# Patient Record
Sex: Female | Born: 1965 | Race: Black or African American | Hispanic: No | Marital: Married | Smoking: Never smoker
Health system: Southern US, Community
[De-identification: ages and names within clinical notes are randomized; demographics above are authoritative.]

## PROBLEM LIST (undated history)

## (undated) DIAGNOSIS — F419 Anxiety disorder, unspecified: Secondary | ICD-10-CM

## (undated) DIAGNOSIS — G43909 Migraine, unspecified, not intractable, without status migrainosus: Secondary | ICD-10-CM

## (undated) DIAGNOSIS — I10 Essential (primary) hypertension: Secondary | ICD-10-CM

---

## 2006-02-15 ENCOUNTER — Emergency Department (HOSPITAL_COMMUNITY): Admission: EM | Admit: 2006-02-15 | Discharge: 2006-02-15 | Payer: Self-pay | Admitting: Emergency Medicine

## 2006-05-04 ENCOUNTER — Inpatient Hospital Stay (HOSPITAL_COMMUNITY): Admission: AD | Admit: 2006-05-04 | Discharge: 2006-05-04 | Payer: Self-pay | Admitting: Obstetrics and Gynecology

## 2006-05-07 ENCOUNTER — Inpatient Hospital Stay (HOSPITAL_COMMUNITY): Admission: AD | Admit: 2006-05-07 | Discharge: 2006-05-07 | Payer: Self-pay | Admitting: Obstetrics and Gynecology

## 2006-05-14 ENCOUNTER — Inpatient Hospital Stay (HOSPITAL_COMMUNITY): Admission: AD | Admit: 2006-05-14 | Discharge: 2006-05-14 | Payer: Self-pay | Admitting: Obstetrics and Gynecology

## 2006-05-21 ENCOUNTER — Inpatient Hospital Stay (HOSPITAL_COMMUNITY): Admission: AD | Admit: 2006-05-21 | Discharge: 2006-05-21 | Payer: Self-pay | Admitting: Obstetrics and Gynecology

## 2006-09-13 ENCOUNTER — Emergency Department (HOSPITAL_COMMUNITY): Admission: EM | Admit: 2006-09-13 | Discharge: 2006-09-13 | Payer: Self-pay | Admitting: Emergency Medicine

## 2006-10-18 ENCOUNTER — Emergency Department (HOSPITAL_COMMUNITY): Admission: EM | Admit: 2006-10-18 | Discharge: 2006-10-18 | Payer: Self-pay | Admitting: Emergency Medicine

## 2007-03-30 ENCOUNTER — Emergency Department (HOSPITAL_COMMUNITY): Admission: EM | Admit: 2007-03-30 | Discharge: 2007-03-30 | Payer: Self-pay | Admitting: *Deleted

## 2007-03-30 ENCOUNTER — Inpatient Hospital Stay (HOSPITAL_COMMUNITY): Admission: AD | Admit: 2007-03-30 | Discharge: 2007-03-30 | Payer: Self-pay | Admitting: Obstetrics and Gynecology

## 2007-09-04 ENCOUNTER — Inpatient Hospital Stay (HOSPITAL_COMMUNITY): Admission: AD | Admit: 2007-09-04 | Discharge: 2007-09-04 | Payer: Self-pay | Admitting: Obstetrics & Gynecology

## 2007-11-21 ENCOUNTER — Emergency Department (HOSPITAL_COMMUNITY): Admission: EM | Admit: 2007-11-21 | Discharge: 2007-11-22 | Payer: Self-pay | Admitting: Emergency Medicine

## 2007-12-26 ENCOUNTER — Emergency Department (HOSPITAL_COMMUNITY): Admission: EM | Admit: 2007-12-26 | Discharge: 2007-12-26 | Payer: Self-pay | Admitting: Family Medicine

## 2008-02-22 ENCOUNTER — Inpatient Hospital Stay (HOSPITAL_COMMUNITY): Admission: AD | Admit: 2008-02-22 | Discharge: 2008-02-22 | Payer: Self-pay | Admitting: Obstetrics & Gynecology

## 2008-03-29 ENCOUNTER — Inpatient Hospital Stay (HOSPITAL_COMMUNITY): Admission: AD | Admit: 2008-03-29 | Discharge: 2008-03-29 | Payer: Self-pay | Admitting: Obstetrics & Gynecology

## 2008-03-31 ENCOUNTER — Inpatient Hospital Stay (HOSPITAL_COMMUNITY): Admission: AD | Admit: 2008-03-31 | Discharge: 2008-03-31 | Payer: Self-pay | Admitting: Obstetrics & Gynecology

## 2008-04-07 ENCOUNTER — Inpatient Hospital Stay (HOSPITAL_COMMUNITY): Admission: AD | Admit: 2008-04-07 | Discharge: 2008-04-07 | Payer: Self-pay | Admitting: Obstetrics & Gynecology

## 2008-04-14 ENCOUNTER — Inpatient Hospital Stay (HOSPITAL_COMMUNITY): Admission: AD | Admit: 2008-04-14 | Discharge: 2008-04-14 | Payer: Self-pay | Admitting: Obstetrics & Gynecology

## 2008-04-21 ENCOUNTER — Inpatient Hospital Stay (HOSPITAL_COMMUNITY): Admission: AD | Admit: 2008-04-21 | Discharge: 2008-04-21 | Payer: Self-pay | Admitting: Family Medicine

## 2008-09-24 ENCOUNTER — Emergency Department (HOSPITAL_COMMUNITY): Admission: EM | Admit: 2008-09-24 | Discharge: 2008-09-25 | Payer: Self-pay | Admitting: Emergency Medicine

## 2009-02-13 ENCOUNTER — Emergency Department (HOSPITAL_COMMUNITY): Admission: EM | Admit: 2009-02-13 | Discharge: 2009-02-13 | Payer: Self-pay | Admitting: Emergency Medicine

## 2009-02-28 ENCOUNTER — Inpatient Hospital Stay (HOSPITAL_COMMUNITY): Admission: AD | Admit: 2009-02-28 | Discharge: 2009-02-28 | Payer: Self-pay | Admitting: Obstetrics & Gynecology

## 2009-04-11 ENCOUNTER — Inpatient Hospital Stay (HOSPITAL_COMMUNITY): Admission: AD | Admit: 2009-04-11 | Discharge: 2009-04-11 | Payer: Self-pay | Admitting: Family Medicine

## 2009-05-03 ENCOUNTER — Inpatient Hospital Stay (HOSPITAL_COMMUNITY): Admission: AD | Admit: 2009-05-03 | Discharge: 2009-05-03 | Payer: Self-pay | Admitting: Obstetrics & Gynecology

## 2009-06-01 ENCOUNTER — Ambulatory Visit (HOSPITAL_COMMUNITY): Admission: RE | Admit: 2009-06-01 | Discharge: 2009-06-01 | Payer: Self-pay | Admitting: Obstetrics

## 2009-06-10 ENCOUNTER — Ambulatory Visit: Payer: Self-pay | Admitting: Family

## 2009-06-10 ENCOUNTER — Inpatient Hospital Stay (HOSPITAL_COMMUNITY): Admission: AD | Admit: 2009-06-10 | Discharge: 2009-06-10 | Payer: Self-pay | Admitting: Obstetrics

## 2009-06-11 ENCOUNTER — Inpatient Hospital Stay (HOSPITAL_COMMUNITY): Admission: AD | Admit: 2009-06-11 | Discharge: 2009-06-15 | Payer: Self-pay | Admitting: Obstetrics

## 2009-08-19 ENCOUNTER — Inpatient Hospital Stay (HOSPITAL_COMMUNITY): Admission: AD | Admit: 2009-08-19 | Discharge: 2009-08-20 | Payer: Self-pay | Admitting: Obstetrics

## 2009-09-14 ENCOUNTER — Ambulatory Visit (HOSPITAL_COMMUNITY): Admission: RE | Admit: 2009-09-14 | Discharge: 2009-09-14 | Payer: Self-pay | Admitting: Obstetrics & Gynecology

## 2009-10-09 ENCOUNTER — Inpatient Hospital Stay (HOSPITAL_COMMUNITY): Admission: AD | Admit: 2009-10-09 | Discharge: 2009-10-09 | Payer: Self-pay | Admitting: Obstetrics

## 2009-10-27 ENCOUNTER — Encounter: Payer: Self-pay | Admitting: Obstetrics & Gynecology

## 2009-10-27 ENCOUNTER — Inpatient Hospital Stay (HOSPITAL_COMMUNITY): Admission: RE | Admit: 2009-10-27 | Discharge: 2009-10-30 | Payer: Self-pay | Admitting: Obstetrics & Gynecology

## 2009-11-08 ENCOUNTER — Inpatient Hospital Stay (HOSPITAL_COMMUNITY): Admission: AD | Admit: 2009-11-08 | Discharge: 2009-11-11 | Payer: Self-pay | Admitting: Obstetrics

## 2009-11-08 ENCOUNTER — Ambulatory Visit: Payer: Self-pay | Admitting: Gynecology

## 2010-06-09 LAB — COMPREHENSIVE METABOLIC PANEL
Alkaline Phosphatase: 101 U/L (ref 39–117)
Calcium: 8.7 mg/dL (ref 8.4–10.5)
GFR calc Af Amer: >60 mL/min (ref >60)
Potassium: 3.7 mEq/L (ref 3.5–5.1)
Total Protein: 6.5 g/dL (ref 6.0–8.3)

## 2010-06-09 LAB — CBC
HCT: 34.7 % — ABNORMAL LOW (ref 36.0–46.0)
Hemoglobin: 11.2 g/dL — ABNORMAL LOW (ref 12.0–15.0)
MCH: 26.5 pg (ref 26.0–34.0)
MCHC: 33.6 g/dL (ref 30.0–36.0)
MCV: 81.7 fL (ref 78.0–100.0)
MCV: 81.9 fL (ref 78.0–100.0)
Platelets: 319 10*3/uL (ref 150–400)
Platelets: 465 10*3/uL — ABNORMAL HIGH (ref 150–400)
RBC: 4.24 MIL/uL (ref 3.87–5.11)
RDW: 17.2 % — ABNORMAL HIGH (ref 11.5–15.5)
WBC: 8.6 10*3/uL (ref 4.0–10.5)

## 2010-06-09 LAB — MRSA CULTURE

## 2010-06-09 LAB — TYPE AND SCREEN: ABO/RH(D): A POS

## 2010-06-09 LAB — BASIC METABOLIC PANEL
GFR calc non Af Amer: >60 mL/min (ref >60)
Potassium: 3.5 mEq/L (ref 3.5–5.1)
Sodium: 138 mEq/L (ref 135–145)

## 2010-06-09 LAB — MAGNESIUM: Magnesium: 5.7 mg/dL — ABNORMAL HIGH (ref 1.5–2.5)

## 2010-06-10 LAB — CBC
Hemoglobin: 10.8 g/dL — ABNORMAL LOW (ref 12.0–15.0)
MCHC: 33.5 g/dL (ref 30.0–36.0)
Platelets: 398 10*3/uL (ref 150–400)
RBC: 3.99 MIL/uL (ref 3.87–5.11)

## 2010-06-10 LAB — SURGICAL PCR SCREEN: Staphylococcus aureus: NEGATIVE

## 2010-06-10 LAB — RPR: RPR Ser Ql: NONREACTIVE

## 2010-06-11 LAB — SAMPLE TO BLOOD BANK

## 2010-06-11 LAB — URINALYSIS, ROUTINE W REFLEX MICROSCOPIC
Bilirubin Urine: NEGATIVE
Ketones, ur: NEGATIVE mg/dL
Nitrite: NEGATIVE
Protein, ur: NEGATIVE mg/dL
Specific Gravity, Urine: >1.03 — ABNORMAL HIGH (ref 1.005–1.030)
Urobilinogen, UA: 0.2 mg/dL (ref 0.0–1.0)

## 2010-06-11 LAB — CBC
MCHC: 33 g/dL (ref 30.0–36.0)
Platelets: 479 10*3/uL — ABNORMAL HIGH (ref 150–400)
RBC: 4.09 MIL/uL (ref 3.87–5.11)

## 2010-06-11 LAB — GLUCOSE, CAPILLARY: Glucose-Capillary: 107 mg/dL — ABNORMAL HIGH (ref 70–99)

## 2010-06-11 LAB — URINE MICROSCOPIC-ADD ON

## 2010-06-12 LAB — CBC
HCT: 31.5 % — ABNORMAL LOW (ref 36.0–46.0)
Hemoglobin: 10.7 g/dL — ABNORMAL LOW (ref 12.0–15.0)
RBC: 3.82 MIL/uL — ABNORMAL LOW (ref 3.87–5.11)
RDW: 14.9 % (ref 11.5–15.5)
WBC: 7.2 10*3/uL (ref 4.0–10.5)

## 2010-06-12 LAB — URINALYSIS, ROUTINE W REFLEX MICROSCOPIC
Glucose, UA: NEGATIVE mg/dL
Nitrite: NEGATIVE
Specific Gravity, Urine: 1.025 (ref 1.005–1.030)
pH: 6 (ref 5.0–8.0)

## 2010-06-12 LAB — COMPREHENSIVE METABOLIC PANEL
ALT: 15 U/L (ref 0–35)
Albumin: 2.5 g/dL — ABNORMAL LOW (ref 3.5–5.2)
Alkaline Phosphatase: 107 U/L (ref 39–117)
BUN: 7 mg/dL (ref 6–23)
Chloride: 109 mEq/L (ref 96–112)
Glucose, Bld: 92 mg/dL (ref 70–99)
Potassium: 4.2 mEq/L (ref 3.5–5.1)
Sodium: 136 mEq/L (ref 135–145)
Total Bilirubin: 0.3 mg/dL (ref 0.3–1.2)
Total Protein: 5.5 g/dL — ABNORMAL LOW (ref 6.0–8.3)

## 2010-06-18 LAB — COMPREHENSIVE METABOLIC PANEL
ALT: 12 U/L (ref 0–35)
AST: 19 U/L (ref 0–37)
Albumin: 2.6 g/dL — ABNORMAL LOW (ref 3.5–5.2)
Calcium: 8.6 mg/dL (ref 8.4–10.5)
Creatinine, Ser: 0.57 mg/dL (ref 0.4–1.2)
GFR calc Af Amer: >60 mL/min (ref >60)
Sodium: 134 mEq/L — ABNORMAL LOW (ref 135–145)

## 2010-06-18 LAB — DIFFERENTIAL
Eosinophils Absolute: 0.3 10*3/uL (ref 0.0–0.7)
Eosinophils Relative: 6 % — ABNORMAL HIGH (ref 0–5)
Lymphocytes Relative: 22 % (ref 12–46)
Lymphs Abs: 1.1 10*3/uL (ref 0.7–4.0)
Monocytes Absolute: 0.4 10*3/uL (ref 0.1–1.0)
Monocytes Relative: 7 % (ref 3–12)

## 2010-06-18 LAB — CBC
MCHC: 33.1 g/dL (ref 30.0–36.0)
MCV: 82.5 fL (ref 78.0–100.0)
Platelets: 378 10*3/uL (ref 150–400)
RBC: 3.77 MIL/uL — ABNORMAL LOW (ref 3.87–5.11)
WBC: 5.1 10*3/uL (ref 4.0–10.5)

## 2010-06-27 LAB — URINALYSIS, ROUTINE W REFLEX MICROSCOPIC
Bilirubin Urine: NEGATIVE
Glucose, UA: NEGATIVE mg/dL
Ketones, ur: NEGATIVE mg/dL
Leukocytes, UA: NEGATIVE
pH: 6 (ref 5.0–8.0)

## 2010-06-27 LAB — URINE MICROSCOPIC-ADD ON

## 2010-06-27 LAB — POCT PREGNANCY, URINE: Preg Test, Ur: POSITIVE

## 2010-07-02 LAB — URINALYSIS, ROUTINE W REFLEX MICROSCOPIC
Bilirubin Urine: NEGATIVE
Ketones, ur: NEGATIVE mg/dL
Nitrite: NEGATIVE
Protein, ur: NEGATIVE mg/dL
pH: 6.5 (ref 5.0–8.0)

## 2010-07-02 LAB — COMPREHENSIVE METABOLIC PANEL
BUN: 9 mg/dL (ref 6–23)
Calcium: 9 mg/dL (ref 8.4–10.5)
Glucose, Bld: 94 mg/dL (ref 70–99)
Sodium: 137 mEq/L (ref 135–145)
Total Protein: 6.9 g/dL (ref 6.0–8.3)

## 2010-07-02 LAB — DIFFERENTIAL
Lymphs Abs: 1.5 10*3/uL (ref 0.7–4.0)
Monocytes Relative: 7 % (ref 3–12)
Neutro Abs: 2.6 10*3/uL (ref 1.7–7.7)
Neutrophils Relative %: 55 % (ref 43–77)

## 2010-07-02 LAB — CBC
HCT: 36.5 % (ref 36.0–46.0)
Hemoglobin: 11.9 g/dL — ABNORMAL LOW (ref 12.0–15.0)
MCHC: 32.6 g/dL (ref 30.0–36.0)
Platelets: 543 10*3/uL — ABNORMAL HIGH (ref 150–400)
RDW: 16 % — ABNORMAL HIGH (ref 11.5–15.5)

## 2010-07-02 LAB — PREGNANCY, URINE: Preg Test, Ur: NEGATIVE

## 2010-07-02 LAB — POCT CARDIAC MARKERS
CKMB, poc: <1 ng/mL — ABNORMAL LOW (ref 1.0–8.0)
Myoglobin, poc: 36.8 ng/mL (ref 12–200)
Troponin i, poc: <0.05 ng/mL (ref 0.00–0.09)

## 2010-07-10 LAB — CBC
HCT: 31.1 % — ABNORMAL LOW (ref 36.0–46.0)
Hemoglobin: 10.4 g/dL — ABNORMAL LOW (ref 12.0–15.0)
RBC: 3.96 MIL/uL (ref 3.87–5.11)
WBC: 7 10*3/uL (ref 4.0–10.5)

## 2010-07-10 LAB — HCG, QUANTITATIVE, PREGNANCY
hCG, Beta Chain, Quant, S: 58 m[IU]/mL — ABNORMAL HIGH (ref <5)
hCG, Beta Chain, Quant, S: 12 m[IU]/mL — ABNORMAL HIGH (ref <5)
hCG, Beta Chain, Quant, S: 5 m[IU]/mL — ABNORMAL HIGH (ref <5)

## 2010-07-10 LAB — ABO/RH: ABO/RH(D): A POS

## 2010-12-14 LAB — CBC
HCT: 34.6 — ABNORMAL LOW
Hemoglobin: 11.1 — ABNORMAL LOW
Hemoglobin: 11.4 — ABNORMAL LOW
MCHC: 33.8
MCV: 77.8 — ABNORMAL LOW
RBC: 4.22
RBC: 4.39
WBC: 5.7
WBC: 5.5

## 2010-12-14 LAB — COMPREHENSIVE METABOLIC PANEL
AST: 13
Alkaline Phosphatase: 64
BUN: 9
CO2: 29
Chloride: 101
Creatinine, Ser: 0.67
GFR calc Af Amer: >60
GFR calc non Af Amer: >60
Potassium: 4
Total Bilirubin: 0.6

## 2010-12-14 LAB — I-STAT 8, (EC8 V) (CONVERTED LAB)
Acid-Base Excess: 1
BUN: 10
Chloride: 105
Potassium: 4
pCO2, Ven: 50
pH, Ven: 7.342 — ABNORMAL HIGH

## 2010-12-14 LAB — URINALYSIS, ROUTINE W REFLEX MICROSCOPIC
Bilirubin Urine: NEGATIVE
Ketones, ur: NEGATIVE
Nitrite: NEGATIVE
Protein, ur: NEGATIVE
Specific Gravity, Urine: 1.013
Urobilinogen, UA: 0.2
pH: 5.5

## 2010-12-14 LAB — POCT CARDIAC MARKERS
CKMB, poc: <1 — ABNORMAL LOW
Myoglobin, poc: 63.7

## 2010-12-14 LAB — DIFFERENTIAL
Basophils Absolute: 0
Eosinophils Relative: 2
Lymphocytes Relative: 38
Lymphs Abs: 2.1
Monocytes Absolute: 0.5
Monocytes Relative: 10
Neutro Abs: 2.7

## 2010-12-14 LAB — URINE MICROSCOPIC-ADD ON

## 2010-12-14 LAB — POCT I-STAT CREATININE
Creatinine, Ser: 0.8
Operator id: 151321

## 2010-12-14 LAB — POCT PREGNANCY, URINE: Preg Test, Ur: NEGATIVE

## 2010-12-21 LAB — URINALYSIS, ROUTINE W REFLEX MICROSCOPIC
Bilirubin Urine: NEGATIVE
Glucose, UA: NEGATIVE
Protein, ur: NEGATIVE
Specific Gravity, Urine: >1.03 — ABNORMAL HIGH

## 2010-12-21 LAB — URINE MICROSCOPIC-ADD ON

## 2010-12-21 LAB — POCT PREGNANCY, URINE
Operator id: 120561
Preg Test, Ur: NEGATIVE

## 2010-12-21 LAB — WET PREP, GENITAL: Yeast Wet Prep HPF POC: NONE SEEN

## 2010-12-26 LAB — URINE MICROSCOPIC-ADD ON

## 2010-12-26 LAB — URINALYSIS, ROUTINE W REFLEX MICROSCOPIC
Bilirubin Urine: NEGATIVE
Glucose, UA: NEGATIVE
Nitrite: NEGATIVE
Specific Gravity, Urine: 1.025
pH: 5.5

## 2010-12-26 LAB — CBC
MCV: 77.4 — ABNORMAL LOW
Platelets: 518 — ABNORMAL HIGH
WBC: 6.2

## 2010-12-26 LAB — POCT PREGNANCY, URINE: Preg Test, Ur: POSITIVE

## 2011-01-10 LAB — STREP A DNA PROBE: Group A Strep Probe: NEGATIVE

## 2011-01-10 LAB — RAPID STREP SCREEN (MED CTR MEBANE ONLY): Streptococcus, Group A Screen (Direct): NEGATIVE

## 2011-07-31 IMAGING — CR DG CHEST 2V
2 series · 2 of 2 positions shown · non-contrast
Comparison: 09/24/2008

CLINICAL DATA: Fever and cough.  20 weeks pregnant.

CHEST - 2 VIEW

[view not recorded (1 of 2)]
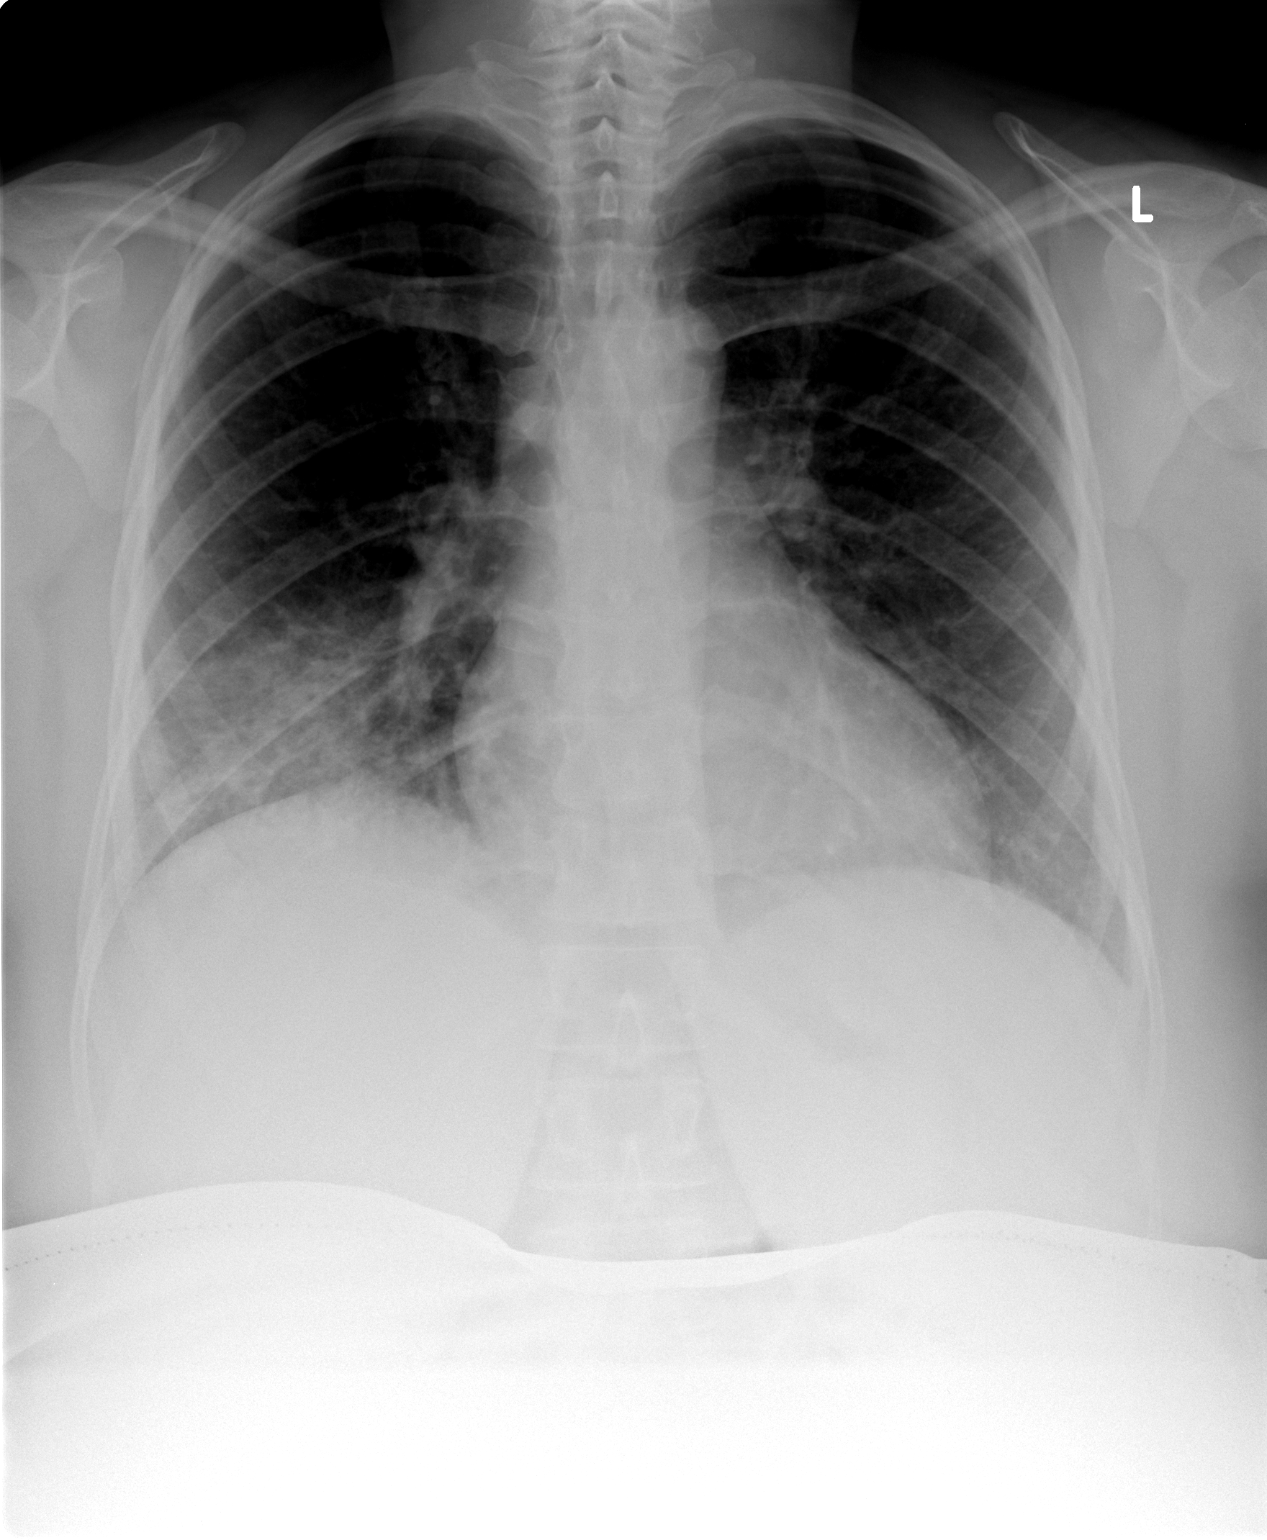

[view not recorded (2 of 2)]
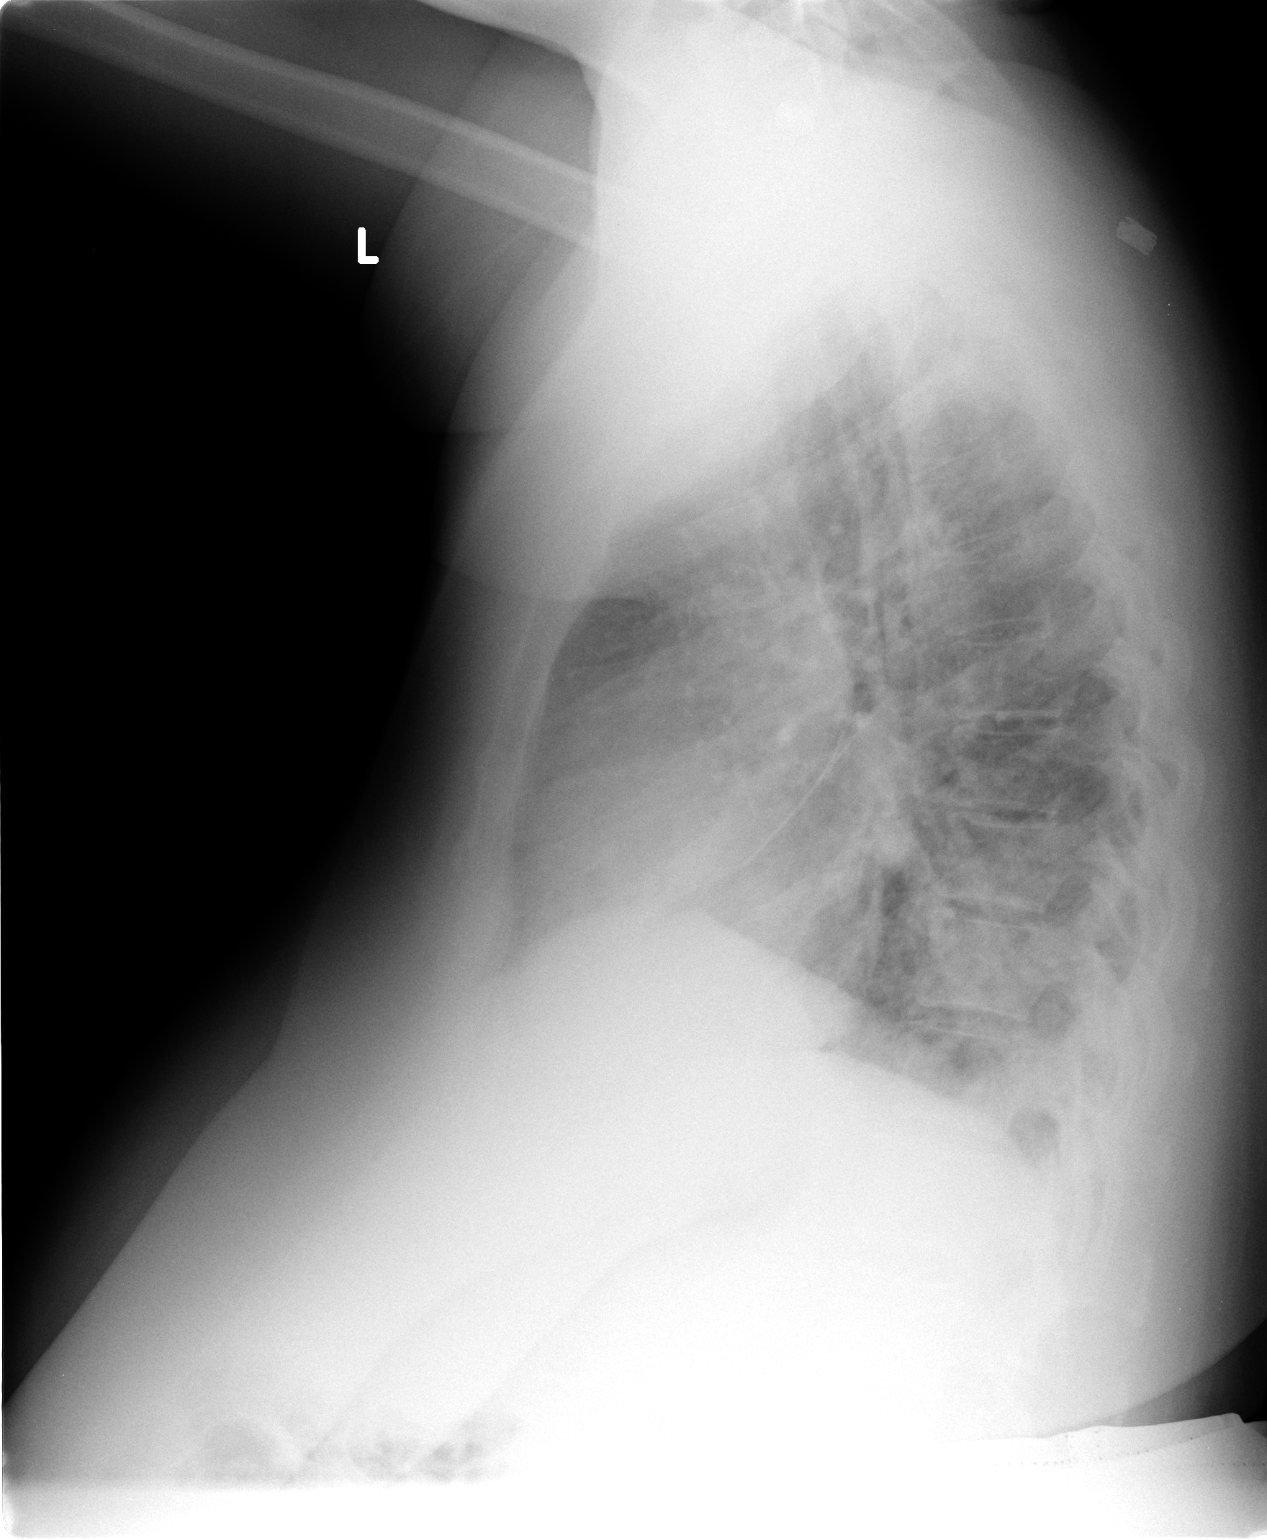

[2 of 2 positions shown; findings below may reference images not displayed]

FINDINGS: Airspace disease is seen in the right lower lobe which is
new and consistent with pneumonia.  Left lung is clear.  No
evidence of pleural effusion.  Heart size and mediastinal contours
are within normal limits.
IMPRESSION: Right lower lobe airspace disease, consistent with pneumonia.

## 2012-01-27 ENCOUNTER — Emergency Department (HOSPITAL_COMMUNITY)
Admission: EM | Admit: 2012-01-27 | Discharge: 2012-01-27 | Disposition: A | Discharge status: Home / Self Care | Payer: Self-pay | Attending: Emergency Medicine | Admitting: Emergency Medicine

## 2012-01-27 ENCOUNTER — Encounter (HOSPITAL_COMMUNITY): Payer: Self-pay

## 2012-01-27 DIAGNOSIS — R0982 Postnasal drip: Secondary | ICD-10-CM | POA: Insufficient documentation

## 2012-01-27 DIAGNOSIS — R059 Cough, unspecified: Secondary | ICD-10-CM | POA: Insufficient documentation

## 2012-01-27 DIAGNOSIS — R05 Cough: Secondary | ICD-10-CM | POA: Insufficient documentation

## 2012-01-27 DIAGNOSIS — J329 Chronic sinusitis, unspecified: Secondary | ICD-10-CM | POA: Insufficient documentation

## 2012-01-27 DIAGNOSIS — J3489 Other specified disorders of nose and nasal sinuses: Secondary | ICD-10-CM | POA: Insufficient documentation

## 2012-01-27 DIAGNOSIS — H9210 Otorrhea, unspecified ear: Secondary | ICD-10-CM | POA: Insufficient documentation

## 2012-01-27 MED ORDER — AZITHROMYCIN 250 MG PO TABS
500.0000 mg | ORAL_TABLET | Freq: Once | ORAL | Status: AC
  Administered 2012-01-27: 500 mg via ORAL
  Filled 2012-01-27: qty 2

## 2012-01-27 MED ORDER — OXYMETAZOLINE HCL 0.05 % NA SOLN: 2.0000 | Freq: Two times a day (BID) | NASAL | Status: DC

## 2012-01-27 MED ORDER — AZITHROMYCIN 250 MG PO TABS: 250.0000 mg | ORAL_TABLET | Freq: Every day | ORAL | Status: DC

## 2012-01-27 NOTE — ED Provider Notes (Signed)
History     CSN: [id number]  Arrival date & time 01/27/12  1407   First MD Initiated Contact with Patient 01/27/12 1413      Chief Complaint  Patient presents with  . Headache    (Consider location/radiation/quality/duration/timing/severity/associated sxs/prior treatment) HPI Comments: Patient presents with a chief complaint of productive cough, nasal congestion, and sinus pressure.  She reports that symptoms have been present for the past month and are gradually worsening.  She has not taken anything for her symptoms.  No medical evaluation prior to today.  She reports that her symptoms are similar to when she has had a sinus infection in the past.  She denies fever or chills.  She has not vomited.  She denies nausea.  No changes in vision.  No dizziness.  No neck pain or stiffness.    The history is provided by the patient.    History reviewed. No pertinent past medical history.  History reviewed. No pertinent past surgical history.  History reviewed. No pertinent family history.  History  Substance Use Topics  . Smoking status: Not on file  . Smokeless tobacco: Not on file  . Alcohol Use: Not on file    OB History    Grav Para Term Preterm Abortions TAB SAB Ect Mult Living                  Review of Systems  Constitutional: Negative for fever and chills.  HENT: Positive for congestion, rhinorrhea, postnasal drip, sinus pressure and ear discharge. Negative for ear pain, sore throat, neck pain and neck stiffness.   Respiratory: Positive for cough. Negative for chest tightness, shortness of breath and wheezing.   Gastrointestinal: Negative for nausea and vomiting.  Skin: Negative for rash.  Neurological: Negative for dizziness, syncope, facial asymmetry, speech difficulty, weakness and light-headedness.  Psychiatric/Behavioral: Negative for confusion.    Allergies  Review of patient's allergies indicates no known allergies.  Home Medications  No current outpatient  prescriptions on file.  BP 118/45  Pulse 78  Temp 97.6 F (36.4 C) (Oral)  Resp 18  SpO2 100%  Physical Exam  Nursing note and vitals reviewed. Constitutional: She appears well-developed and well-nourished. No distress.  HENT:  Head: Normocephalic and atraumatic.  Right Ear: Tympanic membrane and ear canal normal.  Left Ear: Tympanic membrane and ear canal normal.  Nose: Mucosal edema and rhinorrhea present. Right sinus exhibits frontal sinus tenderness. Right sinus exhibits no maxillary sinus tenderness. Left sinus exhibits frontal sinus tenderness. Left sinus exhibits no maxillary sinus tenderness.  Mouth/Throat: Uvula is midline, oropharynx is clear and moist and mucous membranes are normal.  Eyes: EOM are normal. Pupils are equal, round, and reactive to light.  Neck: Normal range of motion. Neck supple.  Cardiovascular: Normal rate, regular rhythm and normal heart sounds.   Pulmonary/Chest: Effort normal and breath sounds normal. No respiratory distress. She has no wheezes. She has no rhonchi. She has no rales. She exhibits no tenderness.  Musculoskeletal: Normal range of motion.  Neurological: She is alert. No cranial nerve deficit. Gait normal.  Skin: Skin is warm and dry. No rash noted. She is not diaphoretic.  Psychiatric: She has a normal mood and affect.    ED Course  Procedures (including critical care time)  Labs Reviewed - No data to display No results found.   No diagnosis found.    MDM  History and physical exam consistent with Acute Sinusitis.  Patient discharged home with antibioitc and instructions to  take a decongestant.  Patient verbalizes understanding and is in agreement with the plan.  Return precautions discussed.        Pascal Lux [place], PA-C 01/28/12 1157

## 2012-01-27 NOTE — ED Notes (Signed)
Headache x 1 month with sinus congestion., coughing, vomiting

## 2012-01-28 NOTE — ED Provider Notes (Signed)
Medical screening examination/treatment/procedure(s) were performed by non-physician practitioner and as supervising physician I was immediately available for consultation/collaboration.   Gilliam Hawkes H Zakhai Meisinger, MD 01/28/12 1546 

## 2012-02-17 ENCOUNTER — Encounter (HOSPITAL_COMMUNITY): Payer: Self-pay | Admitting: *Deleted

## 2012-02-17 ENCOUNTER — Emergency Department (HOSPITAL_COMMUNITY)
Admission: EM | Admit: 2012-02-17 | Discharge: 2012-02-17 | Disposition: A | Discharge status: Home / Self Care | Payer: Self-pay | Attending: Emergency Medicine | Admitting: Emergency Medicine

## 2012-02-17 DIAGNOSIS — J069 Acute upper respiratory infection, unspecified: Secondary | ICD-10-CM | POA: Insufficient documentation

## 2012-02-17 DIAGNOSIS — J019 Acute sinusitis, unspecified: Secondary | ICD-10-CM | POA: Insufficient documentation

## 2012-02-17 DIAGNOSIS — R509 Fever, unspecified: Secondary | ICD-10-CM | POA: Insufficient documentation

## 2012-02-17 DIAGNOSIS — R51 Headache: Secondary | ICD-10-CM | POA: Insufficient documentation

## 2012-02-17 MED ORDER — AMOXICILLIN-POT CLAVULANATE 500-125 MG PO TABS: 1.0000 | ORAL_TABLET | Freq: Three times a day (TID) | ORAL | Status: DC

## 2012-02-17 NOTE — ED Notes (Signed)
Patient has had uri sx, fever, headache and shortness of breath for 4 days.  She had recent similar sx treated with antibiotic.  Patient has nasal congestion noted as well

## 2012-02-17 NOTE — ED Provider Notes (Signed)
History  This chart was scribed for Geoffery Lyons, MD by Shari Heritage, ED Scribe. The patient was seen in room TR09C/TR09C. Patient's care was started at 1356.   CSN: [id number]  Arrival date & time 02/17/12  1331   First MD Initiated Contact with Patient 02/17/12 1356      Chief Complaint  Patient presents with  . Shortness of Breath  . Fever  . URI     The history is provided by the patient. No language interpreter was used.    HPI Comments: Samantha Logan is a 46 y.o. female who presents to the Emergency Department complaining of moderate dull frontal HA, nasal congestion, mild SOB and fever. Patient states that her symptoms have worsened over the past 4 days, but she has been experiencing these same symptoms for the past month. Patient states that the symptoms are worse at home. She hasn't taken Tylenol or Motrin for fever reduction. Patient was seen here for productive cough, nasal congestion and sinus pressure on 01/27/12 by Magnus Sinning, PA. Patient was diagnosed with acute sinusitis and given zithromax 500 mg in the ED and discharged home with a 4-day course of zithromax 250 mg. Patient has children and states that are not sick. Patient does not smoker or drink alcohol. Patient reports no other significant past medical or surgical history.   No family history on file.  History  Substance Use Topics  . Smoking status: Never Smoker   . Smokeless tobacco: Not on file  . Alcohol Use: No    OB History    Grav Para Term Preterm Abortions TAB SAB Ect Mult Living                  Review of Systems  Constitutional: Positive for fever.  HENT: Positive for congestion.   Respiratory: Positive for shortness of breath.   Neurological: Positive for headaches.  All other systems reviewed and are negative.    Allergies  Review of patient's allergies indicates no known allergies.  Home Medications   Current Outpatient Rx  Name  Route  Sig  Dispense  Refill  .  OXYMETAZOLINE HCL 0.05 % NA SOLN   Nasal   Place 2 sprays into the nose 2 (two) times daily.   30 mL   0     Triage Vitals: BP 134/77  Pulse 89  Temp 98 F (36.7 C) (Oral)  Ht 5\' 4"  (1.626 m)  Wt 215 lb (97.523 kg)  BMI 36.90 kg/m2  SpO2 100%  Physical Exam  Constitutional: She is oriented to person, place, and time. She appears well-developed and well-nourished. No distress.  HENT:  Head: Atraumatic.  Right Ear: Tympanic membrane normal.  Left Ear: Tympanic membrane normal.  Nose: Mucosal edema present.  Mouth/Throat: Oropharynx is clear and moist.       TMs are normal bilaterally.   Nasal mucosa are edematous with purulent nasal discharge.   Posterior oropharynx is clear. No cervical adenopathy.  Neck: Normal range of motion.  Cardiovascular: Normal rate and regular rhythm.   No murmur heard. Pulmonary/Chest: Effort normal and breath sounds normal. No respiratory distress. She has no wheezes. She has no rales.  Abdominal: She exhibits no distension.  Musculoskeletal: Normal range of motion.  Lymphadenopathy:    She has no cervical adenopathy.  Neurological: She is alert and oriented to person, place, and time.  Skin: Skin is warm and dry. No rash noted.  Psychiatric: She has a normal mood and affect. Her  behavior is normal.    ED Course  Procedures (including critical care time) DIAGNOSTIC STUDIES: Oxygen Saturation is 100% on room air, normal by my interpretation.    COORDINATION OF CARE: 2:08 PM- Patient informed of current plan for treatment and evaluation and agrees with plan at this time.    No diagnosis found.    MDM  The patient presents with persistent uri for the past month.  She improved with a zpack, however she is worsening again.  Will prescribe augmentin, return prn.      I personally performed the services described in this documentation, which was scribed in my presence. The recorded information has been reviewed and is accurate.       Geoffery Lyons, MD 02/17/12 [phone]

## 2012-02-26 ENCOUNTER — Encounter (HOSPITAL_COMMUNITY): Payer: Self-pay | Admitting: *Deleted

## 2012-02-26 ENCOUNTER — Emergency Department (HOSPITAL_COMMUNITY)
Admission: EM | Admit: 2012-02-26 | Discharge: 2012-02-26 | Disposition: A | Discharge status: Home / Self Care | Payer: Self-pay | Attending: Emergency Medicine | Admitting: Emergency Medicine

## 2012-02-26 DIAGNOSIS — R059 Cough, unspecified: Secondary | ICD-10-CM | POA: Insufficient documentation

## 2012-02-26 DIAGNOSIS — R05 Cough: Secondary | ICD-10-CM | POA: Insufficient documentation

## 2012-02-26 DIAGNOSIS — R0981 Nasal congestion: Secondary | ICD-10-CM

## 2012-02-26 DIAGNOSIS — R509 Fever, unspecified: Secondary | ICD-10-CM | POA: Insufficient documentation

## 2012-02-26 DIAGNOSIS — J029 Acute pharyngitis, unspecified: Secondary | ICD-10-CM | POA: Insufficient documentation

## 2012-02-26 DIAGNOSIS — J3489 Other specified disorders of nose and nasal sinuses: Secondary | ICD-10-CM | POA: Insufficient documentation

## 2012-02-26 MED ORDER — PREDNISONE 20 MG PO TABS: 40.0000 mg | ORAL_TABLET | Freq: Every day | ORAL | Status: AC

## 2012-02-26 MED ORDER — PSEUDOEPHEDRINE HCL ER 120 MG PO TB12: 120.0000 mg | ORAL_TABLET | Freq: Two times a day (BID) | ORAL | Status: DC

## 2012-02-26 MED ORDER — OXYMETAZOLINE HCL 0.05 % NA SOLN
1.0000 | Freq: Once | NASAL | Status: AC
  Administered 2012-02-26: 1 via NASAL
  Filled 2012-02-26: qty 15

## 2012-02-26 MED ORDER — PSEUDOEPHEDRINE HCL ER 120 MG PO TB12
120.0000 mg | ORAL_TABLET | Freq: Two times a day (BID) | ORAL | Status: DC
  Administered 2012-02-26: 120 mg via ORAL
  Filled 2012-02-26 (×2): qty 1

## 2012-02-26 MED ORDER — PREDNISONE 20 MG PO TABS
40.0000 mg | ORAL_TABLET | ORAL | Status: AC
  Administered 2012-02-26: 40 mg via ORAL
  Filled 2012-02-26: qty 2

## 2012-02-26 NOTE — ED Notes (Signed)
Pt has had increased nasal congestion that she has had for 3 months adn has been treated with 2 anbx.

## 2012-02-26 NOTE — ED Notes (Addendum)
Pt c/o sinus congestion x2 mos, started azithromycin and she started to get better then it got worse, pt was next prescribed amoxicilin which did not help her symptoms any, she states the symptoms are worse. Pt has runny nose, congestion, cough, headache, chills. A&Ox4, ambulatory, nad.

## 2012-02-26 NOTE — ED Provider Notes (Signed)
History    This chart was scribed for Samantha Munch, MD, MD by Smitty Pluck, ED Scribe. The patient was seen in room TR04C and the patient's care was started at 5:18PM.   CSN: [id number]  Arrival date & time 02/26/12  1621      Chief Complaint  Patient presents with  . Nasal Congestion    The history is provided by the patient. No language interpreter was used.   Samantha Logan is a 46 y.o. female who presents to the Emergency Department complaining of constant, moderate nasal congestion onset 2 months ago. Pt reports that she has been in ED 3x for similar programs. She has taken 2 different abx treatment courses without relief. She reports having cough, intermittent fever, sore throat and rhinorrhea. Pt denies vomiting, nausea, abdominal pain and any other pain.   History reviewed. No pertinent past medical history.  History reviewed. No pertinent past surgical history.  No family history on file.  History  Substance Use Topics  . Smoking status: Never Smoker   . Smokeless tobacco: Not on file  . Alcohol Use: No    OB History    Grav Para Term Preterm Abortions TAB SAB Ect Mult Living                  Review of Systems  Constitutional:       Per HPI, otherwise negative  HENT:       Per HPI, otherwise negative  Eyes: Negative.   Respiratory:       Per HPI, otherwise negative  Cardiovascular:       Per HPI, otherwise negative  Gastrointestinal: Negative for vomiting.  Genitourinary: Negative.   Musculoskeletal:       Per HPI, otherwise negative  Skin: Negative.   Neurological: Negative for syncope.    Allergies  Review of patient's allergies indicates no known allergies.  Home Medications   Current Outpatient Rx  Name  Route  Sig  Dispense  Refill  . AMOXICILLIN-POT CLAVULANATE 500-125 MG PO TABS   Oral   Take 1 tablet (500 mg total) by mouth every 8 (eight) hours.   21 tablet   0   . OXYMETAZOLINE HCL 0.05 % NA SOLN   Nasal   Place 2 sprays into  the nose 2 (two) times daily.   30 mL   0     BP 114/71  Pulse 93  Temp 98.5 F (36.9 C) (Oral)  Resp 20  SpO2 98%  LMP 02/18/2012  Physical Exam  Nursing note and vitals reviewed. Constitutional: She is oriented to person, place, and time. She appears well-developed and well-nourished. No distress.  HENT:  Head: Normocephalic and atraumatic.  Left Ear: External ear normal.  Mouth/Throat: Oropharynx is clear and moist.       effusion in right TM  Eyes: Conjunctivae normal are normal.  Neck: Neck supple. No tracheal deviation present.  Cardiovascular: Normal rate.   Pulmonary/Chest: Effort normal. No respiratory distress.  Musculoskeletal: Normal range of motion.  Lymphadenopathy:    She has cervical adenopathy (bilaterally).  Neurological: She is alert and oriented to person, place, and time.  Skin: Skin is warm and dry.  Psychiatric: She has a normal mood and affect. Her behavior is normal.    ED Course  Procedures (including critical care time)   COORDINATION OF CARE: 5:22 PM Discussed ED treatment with pt      Labs Reviewed - No data to display No results found.  No diagnosis found.    MDM  I personally performed the services described in this documentation, which was scribed in my presence. The recorded information has been reviewed and is accurate.  This patient presents with concerns of ongoing nasal congestion.  Notably, the patient is afebrile, and has not improved with prior courses of antibiotics.  Given this, there is little suspicion for significant bacterial infection.  The patient is awake, alert, appropriately oriented, with unremarkable vital signs.  We initiated a course of anti-inflammatories, both inhaled and systemic.  We also initiated decongestants.  She was discharged in stable condition with return precautions, ENT followup as needed.  Samantha Munch, MD 02/26/12 [phone]

## 2012-03-26 ENCOUNTER — Encounter (HOSPITAL_COMMUNITY): Payer: Self-pay | Admitting: *Deleted

## 2012-03-26 ENCOUNTER — Emergency Department (HOSPITAL_COMMUNITY)
Admission: EM | Admit: 2012-03-26 | Discharge: 2012-03-26 | Disposition: A | Discharge status: Home / Self Care | Payer: Self-pay | Attending: Emergency Medicine | Admitting: Emergency Medicine

## 2012-03-26 DIAGNOSIS — J329 Chronic sinusitis, unspecified: Secondary | ICD-10-CM | POA: Insufficient documentation

## 2012-03-26 DIAGNOSIS — Z79899 Other long term (current) drug therapy: Secondary | ICD-10-CM | POA: Insufficient documentation

## 2012-03-26 DIAGNOSIS — J3489 Other specified disorders of nose and nasal sinuses: Secondary | ICD-10-CM | POA: Insufficient documentation

## 2012-03-26 MED ORDER — FLUTICASONE PROPIONATE 50 MCG/ACT NA SUSP: 2.0000 | Freq: Every day | NASAL | Status: DC

## 2012-03-26 MED ORDER — LEVOFLOXACIN 750 MG PO TABS: 750.0000 mg | ORAL_TABLET | Freq: Every day | ORAL | Status: DC

## 2012-03-26 NOTE — ED Notes (Signed)
The pt has  Sinus problems since October.  With a running nose and dsrk circles under her eyes

## 2012-03-27 NOTE — ED Provider Notes (Signed)
Medical screening examination/treatment/procedure(s) were performed by non-physician practitioner and as supervising physician I was immediately available for consultation/collaboration.   Milta Croson M Iriana Artley, MD 03/27/12 2039 

## 2012-03-27 NOTE — ED Provider Notes (Signed)
History     CSN: [id number]  Arrival date & time 03/26/12  0150   First MD Initiated Contact with Patient 03/26/12 [phone]      Chief Complaint  Patient presents with  . Facial Pain    (Consider location/radiation/quality/duration/timing/severity/associated sxs/prior treatment) HPI  Samantha Logan is a 47 y.o. female complaining of nasal congestion and sinus pressure worsening over the course of the last 5-7 days. Patient has been seen in the ED for similar complaint multiple times. She was seen at the beginning of the month and given Augmentin. Patient denies any fever, cough, chest pain, shortness of breath, abdominal pain, nausea vomiting, change in bowel or bladder habits.  History reviewed. No pertinent past medical history.  History reviewed. No pertinent past surgical history.  No family history on file.  History  Substance Use Topics  . Smoking status: Never Smoker   . Smokeless tobacco: Not on file  . Alcohol Use: No    OB History    Grav Para Term Preterm Abortions TAB SAB Ect Mult Living                  Review of Systems  Constitutional: Negative for fever.  HENT: Positive for congestion.   Respiratory: Negative for shortness of breath.   Cardiovascular: Negative for chest pain.  Gastrointestinal: Negative for nausea, vomiting, abdominal pain and diarrhea.  All other systems reviewed and are negative.    Allergies  Pork-derived products  Home Medications   Current Outpatient Rx  Name  Route  Sig  Dispense  Refill  . IBUPROFEN 200 MG PO TABS   Oral   Take 800 mg by mouth every 6 (six) hours as needed. For headache         . PSEUDOEPHEDRINE HCL ER 120 MG PO TB12   Oral   Take 1 tablet (120 mg total) by mouth 2 (two) times daily.   10 tablet   0   . AMOXICILLIN-POT CLAVULANATE 500-125 MG PO TABS   Oral   Take 1 tablet (500 mg total) by mouth every 8 (eight) hours.   21 tablet   0   . FLUTICASONE PROPIONATE 50 MCG/ACT NA SUSP   Nasal  Place 2 sprays into the nose daily.   16 g   0   . LEVOFLOXACIN 750 MG PO TABS   Oral   Take 1 tablet (750 mg total) by mouth daily.   7 tablet   0     BP 112/54  Pulse 73  Temp 98.7 F (37.1 C) (Oral)  Resp 18  SpO2 100%  LMP 02/18/2012  Physical Exam  Nursing note and vitals reviewed. Constitutional: She is oriented to person, place, and time. She appears well-developed and well-nourished. No distress.  HENT:  Head: Normocephalic.  Mouth/Throat: Oropharynx is clear and moist.       Posterior pharynx is injected.  Mild tenderness to palpation of right maxillary sinus.  Eyes: Conjunctivae normal and EOM are normal.  Cardiovascular: Normal rate and intact distal pulses.   Pulmonary/Chest: Effort normal and breath sounds normal. No stridor. No respiratory distress. She has no wheezes. She has no rales. She exhibits no tenderness.  Musculoskeletal: Normal range of motion.  Lymphadenopathy:    She has cervical adenopathy.  Neurological: She is alert and oriented to person, place, and time.  Psychiatric: She has a normal mood and affect.    ED Course  Procedures (including critical care time)  Labs Reviewed - No data  to display No results found.   1. Sinusitis       MDM  Patient with repeat visits for sinusitis. I will give her Levaquin and Flonase and also advised nasal saline. Extensive discussion on how surgery may be in order. ENT referral given.  New Prescriptions   FLUTICASONE (FLONASE) 50 MCG/ACT NASAL SPRAY    Place 2 sprays into the nose daily.   LEVOFLOXACIN (LEVAQUIN) 750 MG TABLET    Take 1 tablet (750 mg total) by mouth daily.        Wynetta Emery, PA-C 03/27/12 [phone]

## 2012-04-14 ENCOUNTER — Other Ambulatory Visit: Payer: Self-pay | Admitting: Otolaryngology

## 2012-04-14 DIAGNOSIS — J329 Chronic sinusitis, unspecified: Secondary | ICD-10-CM

## 2012-05-02 ENCOUNTER — Emergency Department (HOSPITAL_COMMUNITY)
Admission: EM | Admit: 2012-05-02 | Discharge: 2012-05-03 | Discharge status: Against Medical Advice | Payer: BC Managed Care – PPO | Attending: Emergency Medicine | Admitting: Emergency Medicine

## 2012-05-02 ENCOUNTER — Encounter (HOSPITAL_COMMUNITY): Payer: Self-pay | Admitting: *Deleted

## 2012-05-02 ENCOUNTER — Emergency Department (HOSPITAL_COMMUNITY): Payer: BC Managed Care – PPO

## 2012-05-02 DIAGNOSIS — R0602 Shortness of breath: Secondary | ICD-10-CM | POA: Insufficient documentation

## 2012-05-02 NOTE — ED Notes (Signed)
Called for pt. But no response.  

## 2012-05-02 NOTE — ED Notes (Addendum)
Had prednisone on 04/14/12.  Only took x 2 pills. Woke up this feeling sick.  "cannot drink water; body shutting down."  VS 142/98, 100, 18, 97% SaO2. Pt. States, "feel shaky, is shaky, cannot text on phone." no appetite, cold chills.

## 2012-05-03 LAB — POCT I-STAT TROPONIN I: Troponin i, poc: 0.01 ng/mL (ref 0.00–0.08)

## 2012-05-03 LAB — CBC WITH DIFFERENTIAL/PLATELET
Basophils Relative: 0 % (ref 0–1)
Eosinophils Absolute: 0.3 10*3/uL (ref 0.0–0.7)
Eosinophils Relative: 4 % (ref 0–5)
Hemoglobin: 10.5 g/dL — ABNORMAL LOW (ref 12.0–15.0)
MCH: 22.5 pg — ABNORMAL LOW (ref 26.0–34.0)
MCHC: 31.3 g/dL (ref 30.0–36.0)
Monocytes Absolute: 0.6 10*3/uL (ref 0.1–1.0)
Monocytes Relative: 8 % (ref 3–12)
Neutrophils Relative %: 63 % (ref 43–77)

## 2012-05-03 LAB — COMPREHENSIVE METABOLIC PANEL
Albumin: 3.5 g/dL (ref 3.5–5.2)
BUN: 10 mg/dL (ref 6–23)
Calcium: 9.7 mg/dL (ref 8.4–10.5)
Creatinine, Ser: 0.61 mg/dL (ref 0.50–1.10)
Potassium: 3.4 mEq/L — ABNORMAL LOW (ref 3.5–5.1)
Total Protein: 7.4 g/dL (ref 6.0–8.3)

## 2012-05-03 LAB — D-DIMER, QUANTITATIVE: D-Dimer, Quant: <0.27 ug/mL-FEU (ref 0.00–0.48)

## 2012-05-03 LAB — POCT I-STAT, CHEM 8
BUN: 10 mg/dL (ref 6–23)
Chloride: 103 mEq/L (ref 96–112)
HCT: 35 % — ABNORMAL LOW (ref 36.0–46.0)
Sodium: 139 mEq/L (ref 135–145)

## 2012-05-03 LAB — URINALYSIS, ROUTINE W REFLEX MICROSCOPIC
Hgb urine dipstick: NEGATIVE
Ketones, ur: NEGATIVE mg/dL
Leukocytes, UA: NEGATIVE
Protein, ur: NEGATIVE mg/dL
Urobilinogen, UA: 0.2 mg/dL (ref 0.0–1.0)

## 2012-05-03 MED ORDER — ACETAMINOPHEN 325 MG PO TABS
ORAL_TABLET | ORAL | Status: AC
  Filled 2012-05-03: qty 2

## 2012-05-09 ENCOUNTER — Other Ambulatory Visit: Payer: Self-pay

## 2012-06-03 ENCOUNTER — Ambulatory Visit
Admission: RE | Admit: 2012-06-03 | Discharge: 2012-06-03 | Disposition: A | Discharge status: Home / Self Care | Payer: BC Managed Care – PPO | Source: Ambulatory Visit | Attending: Otolaryngology | Admitting: Otolaryngology

## 2012-09-16 ENCOUNTER — Emergency Department (HOSPITAL_COMMUNITY): Payer: BC Managed Care – PPO

## 2012-09-16 ENCOUNTER — Emergency Department (HOSPITAL_COMMUNITY)
Admission: EM | Admit: 2012-09-16 | Discharge: 2012-09-16 | Disposition: A | Discharge status: Home / Self Care | Payer: BC Managed Care – PPO | Attending: Emergency Medicine | Admitting: Emergency Medicine

## 2012-09-16 ENCOUNTER — Encounter (HOSPITAL_COMMUNITY): Payer: Self-pay | Admitting: *Deleted

## 2012-09-16 DIAGNOSIS — IMO0002 Reserved for concepts with insufficient information to code with codable children: Secondary | ICD-10-CM | POA: Insufficient documentation

## 2012-09-16 DIAGNOSIS — J329 Chronic sinusitis, unspecified: Secondary | ICD-10-CM | POA: Insufficient documentation

## 2012-09-16 DIAGNOSIS — R05 Cough: Secondary | ICD-10-CM | POA: Insufficient documentation

## 2012-09-16 DIAGNOSIS — R059 Cough, unspecified: Secondary | ICD-10-CM | POA: Insufficient documentation

## 2012-09-16 DIAGNOSIS — I10 Essential (primary) hypertension: Secondary | ICD-10-CM | POA: Insufficient documentation

## 2012-09-16 DIAGNOSIS — Z8679 Personal history of other diseases of the circulatory system: Secondary | ICD-10-CM | POA: Insufficient documentation

## 2012-09-16 DIAGNOSIS — R079 Chest pain, unspecified: Secondary | ICD-10-CM | POA: Insufficient documentation

## 2012-09-16 DIAGNOSIS — H53149 Visual discomfort, unspecified: Secondary | ICD-10-CM | POA: Insufficient documentation

## 2012-09-16 DIAGNOSIS — R0602 Shortness of breath: Secondary | ICD-10-CM | POA: Insufficient documentation

## 2012-09-16 HISTORY — DX: Essential (primary) hypertension: I10

## 2012-09-16 HISTORY — DX: Migraine, unspecified, not intractable, without status migrainosus: G43.909

## 2012-09-16 MED ORDER — DIPHENHYDRAMINE HCL 50 MG/ML IJ SOLN
25.0000 mg | Freq: Once | INTRAMUSCULAR | Status: AC
  Administered 2012-09-16: 25 mg via INTRAVENOUS
  Filled 2012-09-16: qty 1

## 2012-09-16 MED ORDER — KETOROLAC TROMETHAMINE 30 MG/ML IJ SOLN
30.0000 mg | Freq: Once | INTRAMUSCULAR | Status: AC
  Administered 2012-09-16: 30 mg via INTRAVENOUS
  Filled 2012-09-16: qty 1

## 2012-09-16 MED ORDER — DIPHENHYDRAMINE HCL 25 MG PO TABS: 25.0000 mg | ORAL_TABLET | Freq: Four times a day (QID) | ORAL | Status: DC | PRN

## 2012-09-16 MED ORDER — METOCLOPRAMIDE HCL 5 MG/ML IJ SOLN
10.0000 mg | Freq: Once | INTRAMUSCULAR | Status: AC
  Administered 2012-09-16: 10 mg via INTRAVENOUS
  Filled 2012-09-16: qty 2

## 2012-09-16 MED ORDER — SULFAMETHOXAZOLE-TRIMETHOPRIM 800-160 MG PO TABS: 1.0000 | ORAL_TABLET | Freq: Two times a day (BID) | ORAL | Status: DC

## 2012-09-16 MED ORDER — SODIUM CHLORIDE 0.9 % IV BOLUS (SEPSIS)
1000.0000 mL | Freq: Once | INTRAVENOUS | Status: AC
  Administered 2012-09-16: 1000 mL via INTRAVENOUS

## 2012-09-16 NOTE — ED Notes (Signed)
Pt taken out to husband car and helped into vehicle; pt leaving with d/c teaching, prescriptions and follow up care instructions. NAD noted upon d/c. Husband driving home.

## 2012-09-16 NOTE — ED Notes (Signed)
Pt returned from xray

## 2012-09-16 NOTE — ED Notes (Signed)
Pt notified she would be going home and needs to call husband for ride home

## 2012-09-16 NOTE — ED Provider Notes (Signed)
History    CSN: [id number] Arrival date & time 09/16/12  7253  First MD Initiated Contact with Patient 09/16/12 [phone]     Chief Complaint  Patient presents with  . Headache   (Consider location/radiation/quality/duration/timing/severity/associated sxs/prior Treatment) HPI Comments: Patient is a 47 year old female with a past medical history of migraines who presents with a headache for 2 days. Patient reports a gradual onset and progressive worsening of the headache. The pain is sharp, constant and is located in generalized head without radiation. Patient has tried nothing for symptoms without relief. No alleviating/aggravating factors. Patient reports associated sinus pressure, cough, and photophobia. Patient denies fever, nausea, vomiting, diarrhea, numbness/tingling, weakness, visual changes, abdominal pain.   Patient also complains of SOB for the past 2 days. Patient reports gradual onset and progressive worsening. She reports associated central chest pain that is constant with associated productive cough. Patient has not tried anything for relief. No recent surgery, previous DVT/PE, exogenous estrogen, recent travel.    Past Medical History  Diagnosis Date  . Migraine   . Hypertension    History reviewed. No pertinent past surgical history. No family history on file. History  Substance Use Topics  . Smoking status: Never Smoker   . Smokeless tobacco: Not on file  . Alcohol Use: No   OB History   Grav Para Term Preterm Abortions TAB SAB Ect Mult Living                 Review of Systems  HENT: Positive for sinus pressure.   Eyes: Positive for photophobia.  Respiratory: Positive for cough and shortness of breath.   Neurological: Positive for headaches.  All other systems reviewed and are negative.    Allergies  Pork-derived products  Home Medications   Current Outpatient Rx  Name  Route  Sig  Dispense  Refill  . amoxicillin-clavulanate (AUGMENTIN) 500-125 MG per  tablet   Oral   Take 1 tablet (500 mg total) by mouth every 8 (eight) hours.   21 tablet   0   . fluticasone (FLONASE) 50 MCG/ACT nasal spray   Nasal   Place 2 sprays into the nose daily.   16 g   0   . ibuprofen (ADVIL,MOTRIN) 200 MG tablet   Oral   Take 800 mg by mouth every 6 (six) hours as needed. For headache         . levofloxacin (LEVAQUIN) 750 MG tablet   Oral   Take 1 tablet (750 mg total) by mouth daily.   7 tablet   0   . pseudoephedrine (SUDAFED) 120 MG 12 hr tablet   Oral   Take 1 tablet (120 mg total) by mouth 2 (two) times daily.   10 tablet   0    BP 126/79  Pulse 91  Temp(Src) 98.3 F (36.8 C) (Oral)  Resp 20  SpO2 93%  LMP 09/16/2012 Physical Exam  Nursing note and vitals reviewed. Constitutional: She is oriented to person, place, and time. She appears well-developed and well-nourished. No distress.  HENT:  Head: Normocephalic and atraumatic.  Frontal and maxillary sinus tenderness to palpation.   Eyes: Conjunctivae and EOM are normal.  Neck: Normal range of motion.  Cardiovascular: Normal rate and regular rhythm.  Exam reveals no gallop and no friction rub.   No murmur heard. Pulmonary/Chest: Effort normal and breath sounds normal. She has no wheezes. She has no rales. She exhibits no tenderness.  Abdominal: Soft. She exhibits no distension. There  is no tenderness. There is no rebound and no guarding.  Musculoskeletal: Normal range of motion.  Neurological: She is alert and oriented to person, place, and time. Coordination normal.  Speech is goal-oriented. Moves limbs without ataxia.   Skin: Skin is warm and dry.  Psychiatric: She has a normal mood and affect. Her behavior is normal.    ED Course  Procedures (including critical care time)  Labs Reviewed - No data to display   Dg Chest 2 View  09/16/2012   *RADIOLOGY REPORT*  Clinical Data: Shortness of breath  CHEST - 2 VIEW  Comparison: May 02, 2012.  Findings: Cardiomediastinal  silhouette appears normal.  No acute pulmonary disease is noted.  Bony thorax is intact.  IMPRESSION: No acute cardiopulmonary abnormality seen.   Original Report Authenticated By: Lupita Raider.,  M.D.      1. Sinusitis     MDM  9:11 AM Patient will have toradol, reglan and benadryl for headache. Patient will have chest xray for cough and SOB. Patient is PERC negative.   10:05 AM Chest xray unremarkable. Patient likely has sinus infection which would also contributed to complaint of headache. Patient will be discharged with bactrim for sinusitis and recommended PCP follow up as needed. Vitals stable and patient afebrile. No neuro deficits.   10:51 AM Patient feeling better. Patient will be discharged with Bactrim for sinusitis and benadryl for nasal congestion. Vitals stable and patient afebrile.   Emilia Beck, PA-C 09/16/12 1055

## 2012-09-16 NOTE — ED Notes (Signed)
Pt in room getting dressed. Pt verifies that she has called husband to come get her. Pt husband arrived to take pt home. NAD noted upon d/c.

## 2012-09-16 NOTE — ED Notes (Signed)
To ED for eval of HA that started 2 days ago. Photophobia. Pain is frontal. No vomiting. OTC meds not working. Mae x4 freely.

## 2012-09-16 NOTE — ED Notes (Signed)
Pt given d/c teaching and follow up care instructions with prescriptions. Pt verbalizes understanding of d/c teaching and has no further questions upon d/c. Pt instructed not to drive. Pt states she is trying to get in touch with her husband to come back and given her a ride home. NAD noted upon d/c. Pt alert and mentating appropriately. Pt requested wheelchair to leave.

## 2012-09-20 NOTE — ED Provider Notes (Signed)
History/physical exam/procedure(s) were performed by non-physician practitioner and as supervising physician I was immediately available for consultation/collaboration. I have reviewed all notes and am in agreement with care and plan.   Hilario Quarry, MD 09/20/12 [phone]

## 2012-11-05 ENCOUNTER — Encounter (HOSPITAL_COMMUNITY): Payer: Self-pay | Admitting: Emergency Medicine

## 2012-11-05 ENCOUNTER — Emergency Department (HOSPITAL_COMMUNITY)
Admission: EM | Admit: 2012-11-05 | Discharge: 2012-11-05 | Disposition: A | Discharge status: Home / Self Care | Payer: BC Managed Care – PPO | Attending: Emergency Medicine | Admitting: Emergency Medicine

## 2012-11-05 DIAGNOSIS — F419 Anxiety disorder, unspecified: Secondary | ICD-10-CM

## 2012-11-05 DIAGNOSIS — Z792 Long term (current) use of antibiotics: Secondary | ICD-10-CM | POA: Insufficient documentation

## 2012-11-05 DIAGNOSIS — I10 Essential (primary) hypertension: Secondary | ICD-10-CM | POA: Insufficient documentation

## 2012-11-05 DIAGNOSIS — F411 Generalized anxiety disorder: Secondary | ICD-10-CM | POA: Insufficient documentation

## 2012-11-05 DIAGNOSIS — N390 Urinary tract infection, site not specified: Secondary | ICD-10-CM | POA: Insufficient documentation

## 2012-11-05 DIAGNOSIS — R000 Tachycardia, unspecified: Secondary | ICD-10-CM | POA: Insufficient documentation

## 2012-11-05 DIAGNOSIS — Z8679 Personal history of other diseases of the circulatory system: Secondary | ICD-10-CM | POA: Insufficient documentation

## 2012-11-05 DIAGNOSIS — D649 Anemia, unspecified: Secondary | ICD-10-CM | POA: Insufficient documentation

## 2012-11-05 DIAGNOSIS — Z79899 Other long term (current) drug therapy: Secondary | ICD-10-CM | POA: Insufficient documentation

## 2012-11-05 HISTORY — DX: Anxiety disorder, unspecified: F41.9

## 2012-11-05 LAB — BASIC METABOLIC PANEL
BUN: 11 mg/dL (ref 6–23)
Calcium: 8.7 mg/dL (ref 8.4–10.5)
Chloride: 105 mEq/L (ref 96–112)
Creatinine, Ser: 0.63 mg/dL (ref 0.50–1.10)
GFR calc Af Amer: >90 mL/min (ref >90)

## 2012-11-05 LAB — URINE MICROSCOPIC-ADD ON

## 2012-11-05 LAB — CBC
HCT: 29.2 % — ABNORMAL LOW (ref 36.0–46.0)
MCH: 20.5 pg — ABNORMAL LOW (ref 26.0–34.0)
MCV: 68.7 fL — ABNORMAL LOW (ref 78.0–100.0)
Platelets: 595 10*3/uL — ABNORMAL HIGH (ref 150–400)
RDW: 18.3 % — ABNORMAL HIGH (ref 11.5–15.5)

## 2012-11-05 LAB — URINALYSIS, ROUTINE W REFLEX MICROSCOPIC
Glucose, UA: NEGATIVE mg/dL
Ketones, ur: NEGATIVE mg/dL
Nitrite: NEGATIVE
Protein, ur: 100 mg/dL — AB
pH: 5 (ref 5.0–8.0)

## 2012-11-05 MED ORDER — LORAZEPAM 1 MG PO TABS
1.0000 mg | ORAL_TABLET | Freq: Once | ORAL | Status: AC
  Administered 2012-11-05: 1 mg via ORAL
  Filled 2012-11-05: qty 1

## 2012-11-05 MED ORDER — CEPHALEXIN 500 MG PO CAPS: 500.0000 mg | ORAL_CAPSULE | Freq: Four times a day (QID) | ORAL | Status: DC

## 2012-11-05 MED ORDER — FERROUS SULFATE 325 (65 FE) MG PO TABS: 325.0000 mg | ORAL_TABLET | Freq: Every day | ORAL | Status: DC

## 2012-11-05 NOTE — ED Provider Notes (Signed)
CSN: [id number]     Arrival date & time 11/05/12  1810 History     First MD Initiated Contact with Patient 11/05/12 1816     Chief Complaint  Patient presents with  . Anxiety   (Consider location/radiation/quality/duration/timing/severity/associated sxs/prior Treatment) Patient is a 47 y.o. female presenting with anxiety. The history is provided by the patient and medical records. No language interpreter was used.  Anxiety Pertinent negatives include no abdominal pain, chest pain, coughing, diaphoresis, fatigue, fever, headaches, nausea, rash or vomiting.    Samantha Logan is a 47 y.o. female  with a hx of HTN, migraine and anxiety presents to the Emergency Department complaining of gradual, persistent, progressively and rapidly worsening anxiety and feeling of doom.  Pt states she was driving in the car this afternoon when she began to think about her children and began to worry intensely.  She reports that he "body feels bad" and that she doesn't want to die.  She endorses chest tightness and shortness of breath, but is very clear several times that she does not have any chest pain. Nothing makes it better and being alone and thinking about her stress makes it worse.  Pt denies fever, chills, headache, neck pain, abdominal pain, nausea vomiting, diarrhea, weakness, dizziness, syncope.    Past Medical History  Diagnosis Date  . Migraine   . Hypertension   . Anxiety    History reviewed. No pertinent past surgical history. No family history on file. History  Substance Use Topics  . Smoking status: Never Smoker   . Smokeless tobacco: Not on file  . Alcohol Use: No   OB History   Grav Para Term Preterm Abortions TAB SAB Ect Mult Living                 Review of Systems  Constitutional: Negative for fever, diaphoresis, appetite change, fatigue and unexpected weight change.  HENT: Negative for mouth sores and neck stiffness.   Eyes: Negative for visual disturbance.  Respiratory:  Negative for cough, chest tightness, shortness of breath and wheezing.   Cardiovascular: Negative for chest pain.  Gastrointestinal: Negative for nausea, vomiting, abdominal pain, diarrhea and constipation.  Endocrine: Negative for polydipsia, polyphagia and polyuria.  Genitourinary: Negative for dysuria, urgency, frequency and hematuria.  Musculoskeletal: Negative for back pain.  Skin: Negative for rash.  Allergic/Immunologic: Negative for immunocompromised state.  Neurological: Negative for syncope, light-headedness and headaches.  Hematological: Does not bruise/bleed easily.  Psychiatric/Behavioral: Negative for suicidal ideas, sleep disturbance and self-injury. The patient is nervous/anxious.     Allergies  Pork-derived products  Home Medications   Current Outpatient Rx  Name  Route  Sig  Dispense  Refill  . diphenhydrAMINE (BENADRYL) 25 MG tablet   Oral   Take 1 tablet (25 mg total) by mouth every 6 (six) hours as needed for itching (Rash).   30 tablet   0   . cephALEXin (KEFLEX) 500 MG capsule   Oral   Take 1 capsule (500 mg total) by mouth 4 (four) times daily.   40 capsule   0   . ferrous sulfate 325 (65 FE) MG tablet   Oral   Take 1 tablet (325 mg total) by mouth daily.   30 tablet   0    BP 145/67  Pulse 95  Temp(Src) 98.2 F (36.8 C) (Oral)  Resp 17  SpO2 98%  LMP 11/05/2012 Physical Exam  Nursing note and vitals reviewed. Constitutional: She appears well-developed and well-nourished. No distress.  Awake, alert, nontoxic appearance  HENT:  Head: Normocephalic and atraumatic.  Mouth/Throat: Oropharynx is clear and moist. No oropharyngeal exudate.  Eyes: Conjunctivae and EOM are normal. Pupils are equal, round, and reactive to light. No scleral icterus.  Neck: Normal range of motion. Neck supple.  Cardiovascular: Regular rhythm, S1 normal, S2 normal, normal heart sounds and intact distal pulses.  Tachycardia present.   No murmur heard. Pulses:       Radial pulses are 2+ on the right side, and 2+ on the left side.       Dorsalis pedis pulses are 2+ on the right side, and 2+ on the left side.       Posterior tibial pulses are 2+ on the right side, and 2+ on the left side.  Pulmonary/Chest: Effort normal and breath sounds normal. No respiratory distress. She has no decreased breath sounds. She has no wheezes. She has no rhonchi. She has no rales. She exhibits no tenderness and no bony tenderness.  Abdominal: Soft. Normal appearance and bowel sounds are normal. She exhibits no mass. There is no tenderness. There is no rigidity, no rebound, no guarding and no CVA tenderness.  Musculoskeletal: Normal range of motion. She exhibits no edema.  Lymphadenopathy:       Head (right side): No submental, no submandibular, no tonsillar, no preauricular, no posterior auricular and no occipital adenopathy present.       Head (left side): No submental, no submandibular, no tonsillar, no preauricular, no posterior auricular and no occipital adenopathy present.    She has no cervical adenopathy.       Right cervical: No superficial cervical, no deep cervical and no posterior cervical adenopathy present.      Left cervical: No superficial cervical, no deep cervical and no posterior cervical adenopathy present.  Neurological: She is alert. GCS eye subscore is 4. GCS verbal subscore is 5. GCS motor subscore is 6.  Speech is clear and goal oriented Moves extremities without ataxia  Skin: Skin is warm and dry. She is not diaphoretic.  Psychiatric: Her mood appears anxious.  Pt is very anxious, hyperventilating and intermittently crying stating she doesn't want to die    ED Course   Procedures (including critical care time)  Labs Reviewed  CBC - Abnormal; Notable for the following:    Hemoglobin 8.7 (*)    HCT 29.2 (*)    MCV 68.7 (*)    MCH 20.5 (*)    MCHC 29.8 (*)    RDW 18.3 (*)    Platelets 595 (*)    All other components within normal limits  BASIC  METABOLIC PANEL - Abnormal; Notable for the following:    Potassium 3.3 (*)    All other components within normal limits  URINALYSIS, ROUTINE W REFLEX MICROSCOPIC - Abnormal; Notable for the following:    Color, Urine AMBER (*)    APPearance CLOUDY (*)    Specific Gravity, Urine 1.033 (*)    Hgb urine dipstick LARGE (*)    Bilirubin Urine SMALL (*)    Protein, ur 100 (*)    Leukocytes, UA MODERATE (*)    All other components within normal limits  URINE MICROSCOPIC-ADD ON - Abnormal; Notable for the following:    Bacteria, UA MANY (*)    All other components within normal limits  URINE CULTURE  POCT I-STAT TROPONIN I   ECG:  Date: 11/05/2012  Rate: 94  Rhythm: normal sinus rhythm and premature ventricular contractions (PVC)  QRS Axis: normal  Intervals: normal  ST/T Wave abnormalities: normal  Conduction Disutrbances:none  Narrative Interpretation: nonischemic ECG with multiple PVCs, unchanged from 05/03/12  Old EKG Reviewed: unchanged    No results found. 1. Anxiety   2. Anemia   3. UTI (lower urinary tract infection)     MDM  Samantha Logan presents with hx of anxiety and similar symptoms today. Will obtain basic blood work, UA and give anxiolytic.  Pt states her husband will come and can help fill in the details.    9:30PM Pt continues to c/o feeling badly and being anxious.  Will redose ativan.    10:59 PM Pt feels much better and no longer has feelings of doom.  CBC with anemia to 8.7.  Pt reports heavy periods and completion of 1 month of fasting.  She reports that she has had anemia in the past.  She also requests sleeping medication.  I have advised her to discuss this, her anemia and her anxiety with Dr Mikeal Hawthorne this week.  UA with evidence of a likely UTI.  Will treat outpatient.  Tachycardia has resolved and pts vitals are stable.    Patient presents to the emergency department complaining of symptoms consistent with anxiety.  Patient has a history of same with  similar episodes.  The patient is resting comfortably, in no apparent distress and asymptomatic.  Labs, ECG and vital signs reviewed.  No exophthalmos, pregnancy test negative.  Stress reducing mechanisms discussed including caffeine intake.  Patient has been referred to psychiatric services for follow-up.    Dr. Estell Harpin was consulted with me and agrees with the plan.     Dahlia Client Alvy Alsop, PA-C 11/05/12 2304

## 2012-11-05 NOTE — ED Provider Notes (Signed)
Medical screening examination/treatment/procedure(s) were performed by non-physician practitioner and as supervising physician I was immediately available for consultation/collaboration.   Benny Lennert, MD 11/05/12 [phone]

## 2012-11-05 NOTE — ED Notes (Signed)
Patient ambulated to the bathroom; patient having to hold on to side rail. Patient states that she feels dizzy. Blood pressure obtained after returning from bathroom. Dahlia Client, PA at bedside discussing plan of care with patient.

## 2012-11-05 NOTE — ED Notes (Signed)
PER EMS- pt picked up from home with c/o anxiety.  Family reports pt has hx of anxiety.  Pt report she feels like she is dying.

## 2012-11-06 LAB — URINE CULTURE

## 2012-11-12 ENCOUNTER — Emergency Department (HOSPITAL_COMMUNITY)
Admission: EM | Admit: 2012-11-12 | Discharge: 2012-11-12 | Discharge status: Against Medical Advice | Payer: BC Managed Care – PPO | Attending: Emergency Medicine | Admitting: Emergency Medicine

## 2012-11-12 ENCOUNTER — Encounter (HOSPITAL_COMMUNITY): Payer: Self-pay | Admitting: Emergency Medicine

## 2012-11-12 DIAGNOSIS — Z3202 Encounter for pregnancy test, result negative: Secondary | ICD-10-CM | POA: Insufficient documentation

## 2012-11-12 DIAGNOSIS — F419 Anxiety disorder, unspecified: Secondary | ICD-10-CM

## 2012-11-12 DIAGNOSIS — F411 Generalized anxiety disorder: Secondary | ICD-10-CM | POA: Insufficient documentation

## 2012-11-12 DIAGNOSIS — I10 Essential (primary) hypertension: Secondary | ICD-10-CM | POA: Insufficient documentation

## 2012-11-12 DIAGNOSIS — R52 Pain, unspecified: Secondary | ICD-10-CM | POA: Insufficient documentation

## 2012-11-12 DIAGNOSIS — Z8679 Personal history of other diseases of the circulatory system: Secondary | ICD-10-CM | POA: Insufficient documentation

## 2012-11-12 DIAGNOSIS — G479 Sleep disorder, unspecified: Secondary | ICD-10-CM | POA: Insufficient documentation

## 2012-11-12 DIAGNOSIS — Z79899 Other long term (current) drug therapy: Secondary | ICD-10-CM | POA: Insufficient documentation

## 2012-11-12 LAB — ACETAMINOPHEN LEVEL: Acetaminophen (Tylenol), Serum: <15 ug/mL (ref 10–30)

## 2012-11-12 LAB — COMPREHENSIVE METABOLIC PANEL
Alkaline Phosphatase: 86 U/L (ref 39–117)
BUN: 9 mg/dL (ref 6–23)
CO2: 26 mEq/L (ref 19–32)
Chloride: 102 mEq/L (ref 96–112)
Creatinine, Ser: 0.66 mg/dL (ref 0.50–1.10)
GFR calc Af Amer: >90 mL/min (ref >90)
GFR calc non Af Amer: >90 mL/min (ref >90)
Glucose, Bld: 109 mg/dL — ABNORMAL HIGH (ref 70–99)
Potassium: 3.3 mEq/L — ABNORMAL LOW (ref 3.5–5.1)
Total Bilirubin: <0.1 mg/dL — ABNORMAL LOW (ref 0.3–1.2)

## 2012-11-12 LAB — ETHANOL: Alcohol, Ethyl (B): <11 mg/dL (ref 0–11)

## 2012-11-12 LAB — CBC
HCT: 29.6 % — ABNORMAL LOW (ref 36.0–46.0)
Hemoglobin: 9 g/dL — ABNORMAL LOW (ref 12.0–15.0)
MCV: 68 fL — ABNORMAL LOW (ref 78.0–100.0)
WBC: 8.8 10*3/uL (ref 4.0–10.5)

## 2012-11-12 LAB — SALICYLATE LEVEL: Salicylate Lvl: <2 mg/dL — ABNORMAL LOW (ref 2.8–20.0)

## 2012-11-12 LAB — RAPID URINE DRUG SCREEN, HOSP PERFORMED
Barbiturates: NOT DETECTED
Benzodiazepines: NOT DETECTED

## 2012-11-12 MED ORDER — LORAZEPAM 1 MG PO TABS
2.0000 mg | ORAL_TABLET | Freq: Once | ORAL | Status: AC
  Administered 2012-11-12: 2 mg via ORAL
  Filled 2012-11-12: qty 2

## 2012-11-12 NOTE — ED Notes (Signed)
Patient states she cannot wait any longer and has to leave and go home.  Patient advised test results are pending and they should be back soon. Patient states she cannot wait and is leaving. Patient in NAD at time of departure.

## 2012-11-12 NOTE — ED Notes (Signed)
Patient seen by MD but will return when husband is in room , patient poor historian and not giving MD information, patient pacing in room,

## 2012-11-12 NOTE — ED Notes (Signed)
MD advised patient's husband in room, patient advised MD aware and patient states she cannot wait more than 5 more minutes for MD.  Patient in NAD at this time but is pacing around room.

## 2012-11-12 NOTE — ED Notes (Signed)
Patient c/o not being able to sleep and has not been feeling well for "a while."  Patient is anxious and asking for something that will help her sleep.  Family at bedside.

## 2012-11-12 NOTE — ED Notes (Signed)
PT. REPORTS GENERALIZED BODY ACHES , EAR ACHES  AND ANXIETY/PANIC ATTACK THIS EVENING , RESPIRATIONS UNLABORED .

## 2012-11-12 NOTE — ED Provider Notes (Signed)
CSN: [id number]     Arrival date & time 11/12/12  0155 History     First MD Initiated Contact with Patient 11/12/12 [phone]     Chief Complaint  Patient presents with  . Panic Attack  . Generalized Body Aches   (Consider location/radiation/quality/duration/timing/severity/associated sxs/prior Treatment) HPI Comments: 47 yo female presenting with generalized anxiety, and difficulty sleeping. Has been an ongoing problem for several months. Her husband provides most of the history and he believes that her symptoms are secondary to homesickness. She denies any other associated symptoms including chest pain shortness of breath, nausea vomiting, or diarrhea. No fevers.  Patient is a 47 y.o. female presenting with anxiety.  Anxiety This is a chronic problem. Episode onset: 3 months ago. The problem occurs constantly. The problem has been gradually worsening. Pertinent negatives include no chest pain, no abdominal pain, no headaches and no shortness of breath. Nothing aggravates the symptoms. Nothing relieves the symptoms. She has tried nothing for the symptoms.    Past Medical History  Diagnosis Date  . Migraine   . Hypertension   . Anxiety    History reviewed. No pertinent past surgical history. No family history on file. History  Substance Use Topics  . Smoking status: Never Smoker   . Smokeless tobacco: Not on file  . Alcohol Use: No   OB History   Grav Para Term Preterm Abortions TAB SAB Ect Mult Living                 Review of Systems  Constitutional: Negative for fever.  HENT: Negative for congestion.   Respiratory: Negative for cough and shortness of breath.   Cardiovascular: Negative for chest pain.  Gastrointestinal: Negative for nausea, vomiting, abdominal pain and diarrhea.  Neurological: Negative for headaches.  All other systems reviewed and are negative.    Allergies  Pork-derived products  Home Medications   Current Outpatient Rx  Name  Route  Sig   Dispense  Refill  . cephALEXin (KEFLEX) 500 MG capsule   Oral   Take 1 capsule (500 mg total) by mouth 4 (four) times daily.   40 capsule   0   . diphenhydrAMINE (BENADRYL) 25 MG tablet   Oral   Take 1 tablet (25 mg total) by mouth every 6 (six) hours as needed for itching (Rash).   30 tablet   0   . ferrous sulfate 325 (65 FE) MG tablet   Oral   Take 1 tablet (325 mg total) by mouth daily.   30 tablet   0    BP 147/78  Pulse 91  Temp(Src) 98.6 F (37 C) (Oral)  Resp 18  SpO2 96%  LMP 11/05/2012 Physical Exam  Nursing note and vitals reviewed. Constitutional: She is oriented to person, place, and time. She appears well-developed and well-nourished. No distress.  HENT:  Head: Normocephalic and atraumatic.  Mouth/Throat: Oropharynx is clear and moist.  Eyes: Conjunctivae are normal. No scleral icterus.  Neck: Neck supple.  Cardiovascular: Normal rate, regular rhythm and intact distal pulses.   Pulmonary/Chest: Effort normal and breath sounds normal. No stridor. No respiratory distress.  Abdominal: Soft. Normal appearance and bowel sounds are normal. She exhibits no distension.  Musculoskeletal: Normal range of motion.  Neurological: She is alert and oriented to person, place, and time.  Skin: Skin is warm and dry. No rash noted.  Psychiatric: Her mood appears anxious. Her speech is rapid and/or pressured. She is agitated.  Pacing    ED Course  Procedures (including critical care time)  Labs Reviewed  CBC - Abnormal; Notable for the following:    Hemoglobin 9.0 (*)    HCT 29.6 (*)    MCV 68.0 (*)    MCH 20.7 (*)    RDW 18.5 (*)    Platelets 720 (*)    All other components within normal limits  COMPREHENSIVE METABOLIC PANEL - Abnormal; Notable for the following:    Potassium 3.3 (*)    Glucose, Bld 109 (*)    Total Bilirubin <0.1 (*)    All other components within normal limits  SALICYLATE LEVEL - Abnormal; Notable for the following:    Salicylate Lvl <2.0  (*)    All other components within normal limits  ACETAMINOPHEN LEVEL  ETHANOL  URINE RAPID DRUG SCREEN (HOSP PERFORMED)  POCT PREGNANCY, URINE   No results found. 1. Anxiety     MDM  47 year old female complaining of anxiety and insomnia. States that she is under a lot of stress and feels very homesick. He is pacing the room and is unable to sit still. Most of the history is obtained from her husband. No psychiatric history other than anxiety for a few months.  No SI or HI.  Will give Ativan PO and re-evaluate.    Pt eloped from ED.  Labwork eventually resulted similar to prior.  She was accompanied by her husband and was not felt to be a danger to herself or others.    Candyce Churn, MD 11/13/12 1416

## 2013-02-15 ENCOUNTER — Emergency Department (HOSPITAL_COMMUNITY)
Admission: EM | Admit: 2013-02-15 | Discharge: 2013-02-16 | Disposition: A | Discharge status: Home / Self Care | Payer: BC Managed Care – PPO | Attending: Emergency Medicine | Admitting: Emergency Medicine

## 2013-02-15 ENCOUNTER — Encounter (HOSPITAL_COMMUNITY): Payer: Self-pay | Admitting: Emergency Medicine

## 2013-02-15 DIAGNOSIS — F411 Generalized anxiety disorder: Secondary | ICD-10-CM | POA: Insufficient documentation

## 2013-02-15 DIAGNOSIS — Z79899 Other long term (current) drug therapy: Secondary | ICD-10-CM | POA: Insufficient documentation

## 2013-02-15 DIAGNOSIS — Z8679 Personal history of other diseases of the circulatory system: Secondary | ICD-10-CM | POA: Insufficient documentation

## 2013-02-15 DIAGNOSIS — R002 Palpitations: Secondary | ICD-10-CM

## 2013-02-15 DIAGNOSIS — F3289 Other specified depressive episodes: Secondary | ICD-10-CM | POA: Insufficient documentation

## 2013-02-15 DIAGNOSIS — F329 Major depressive disorder, single episode, unspecified: Secondary | ICD-10-CM

## 2013-02-15 DIAGNOSIS — F419 Anxiety disorder, unspecified: Secondary | ICD-10-CM

## 2013-02-15 DIAGNOSIS — I10 Essential (primary) hypertension: Secondary | ICD-10-CM | POA: Insufficient documentation

## 2013-02-15 DIAGNOSIS — R5381 Other malaise: Secondary | ICD-10-CM | POA: Insufficient documentation

## 2013-02-15 DIAGNOSIS — D649 Anemia, unspecified: Secondary | ICD-10-CM

## 2013-02-15 LAB — URINALYSIS, ROUTINE W REFLEX MICROSCOPIC
Bilirubin Urine: NEGATIVE
Protein, ur: 30 mg/dL — AB
Urobilinogen, UA: 0.2 mg/dL (ref 0.0–1.0)

## 2013-02-15 LAB — URINE MICROSCOPIC-ADD ON

## 2013-02-15 NOTE — ED Notes (Signed)
Pt anxious during assessment.  Kept stating "i need help sleeping; no sleep in 2 days; my heart is beating fast; i have headache after pill".

## 2013-02-15 NOTE — ED Notes (Signed)
Old and new EKG given to Dr Fayrene Fearing

## 2013-02-15 NOTE — ED Notes (Signed)
Per pt, she took 3-5 melatonins.

## 2013-02-15 NOTE — ED Notes (Signed)
Per EMS called out for chest pain and shortness of breath.  Pt not short of breath upon EMS arrival.  EKG showed NSR with PVCs.  Pt given 324 ASA and 1 nitro with no relief.  Pt now c/o insomnia and a headache post nitro.  Nad.

## 2013-02-16 LAB — CBC WITH DIFFERENTIAL/PLATELET
Basophils Absolute: 0 10*3/uL (ref 0.0–0.1)
Eosinophils Absolute: 0.3 10*3/uL (ref 0.0–0.7)
Lymphs Abs: 2.2 10*3/uL (ref 0.7–4.0)
MCH: 21.3 pg — ABNORMAL LOW (ref 26.0–34.0)
MCHC: 30.6 g/dL (ref 30.0–36.0)
MCV: 69.7 fL — ABNORMAL LOW (ref 78.0–100.0)
Monocytes Absolute: 0.5 10*3/uL (ref 0.1–1.0)
Monocytes Relative: 7 % (ref 3–12)
Platelets: 512 10*3/uL — ABNORMAL HIGH (ref 150–400)
RDW: 18.7 % — ABNORMAL HIGH (ref 11.5–15.5)
WBC: 7.1 10*3/uL (ref 4.0–10.5)

## 2013-02-16 LAB — BASIC METABOLIC PANEL
BUN: 9 mg/dL (ref 6–23)
CO2: 25 mEq/L (ref 19–32)
Calcium: 8.5 mg/dL (ref 8.4–10.5)
GFR calc non Af Amer: >90 mL/min (ref >90)
Glucose, Bld: 97 mg/dL (ref 70–99)

## 2013-02-16 LAB — URINE CULTURE
Colony Count: NO GROWTH
Culture: NO GROWTH

## 2013-02-16 MED ORDER — FERROUS SULFATE 325 (65 FE) MG PO TABS: 325.0000 mg | ORAL_TABLET | Freq: Every day | ORAL | Status: DC

## 2013-02-16 NOTE — ED Provider Notes (Signed)
CSN: [id number]     Arrival date & time 02/15/13  2123 History   First MD Initiated Contact with Patient 02/15/13 2256     Chief Complaint  Patient presents with  . Chest Pain   (Consider location/radiation/quality/duration/timing/severity/associated sxs/prior Treatment) HPI 47 year old female presents emergency department from home via EMS with complaint of palpitations, generalized fatigue, and weakness.  She reports symptoms have been ongoing for the past year.  She reports any amount of stress or discord causes her heart to race.  Along with this she feels that her hands are shutting down.  She reports she feels weak all the time.  Patient is unsure why symptoms have occurred.  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  She reports that she has difficulties sleeping.  She reports decreased mood.  Patient also reports problems with allergies.  Her primary care doctor recently put her on antibiotics, which she stopped midway through.  She was feeling better.  She's not on allergy medicines.  Symptoms tonight were with no worse than symptoms.  Over the last year.  Her doctor has started her on an SSRI and Xanax.  She does not feel that they're helping.  She denies any chest pain, no shortness of breath, and no headache. Patient requests being allowed to sleep in the emergency department. Past Medical History  Diagnosis Date  . Migraine   . Hypertension   . Anxiety    History reviewed. No pertinent past surgical history. History reviewed. No pertinent family history. History  Substance Use Topics  . Smoking status: Never Smoker   . Smokeless tobacco: Not on file  . Alcohol Use: No   OB History   Grav Para Term Preterm Abortions TAB SAB Ect Mult Living                 Review of Systems  All other systems reviewed and are negative.   See History of Present Illness; otherwise all other systems are reviewed and  negative  Allergies  Pork-derived products  Home Medications   Current Outpatient Rx  Name  Route  Sig  Dispense  Refill  . ALPRAZolam (XANAX) 0.5 MG tablet   Oral   Take 0.5 mg by mouth 3 (three) times daily.          . Melatonin 3 MG TABS   Oral   Take 1 tablet by mouth at bedtime as needed (for sleep).          BP 118/72  Pulse 79  Temp(Src) 98.7 F (37.1 C) (Oral)  Resp 19  SpO2 100% Physical Exam  Nursing note and vitals reviewed. Constitutional: She is oriented to person, place, and time. She appears well-developed and well-nourished.  HENT:  Head: Normocephalic and atraumatic.  Nose: Nose normal.  Mouth/Throat: Oropharynx is clear and moist.  Eyes: Conjunctivae and EOM are normal. Pupils are equal, round, and reactive to light.  Neck: Normal range of motion. Neck supple. No JVD present. No tracheal deviation present. No thyromegaly present.  Cardiovascular: Normal rate, regular rhythm, normal heart sounds and intact distal pulses.  Exam reveals no gallop and no friction rub.   No murmur heard. Pulmonary/Chest: Effort normal and breath sounds normal. No stridor. No respiratory distress. She has no wheezes. She has no rales. She exhibits no tenderness.  Abdominal: Soft. Bowel sounds are normal. She exhibits no distension and no mass. There is no tenderness. There is no rebound  and no guarding.  Musculoskeletal: Normal range of motion. She exhibits no edema and no tenderness.  Lymphadenopathy:    She has no cervical adenopathy.  Neurological: She is alert and oriented to person, place, and time. She has normal reflexes. No cranial nerve deficit. She exhibits normal muscle tone. Coordination normal.  Skin: Skin is warm and dry. No rash noted. No erythema. No pallor.  Psychiatric: Her behavior is normal. Judgment and thought content normal.  Tearful, flat affect, depressed mood and anxiety    ED Course  Procedures (including critical care time) Labs Review Labs  Reviewed  CBC WITH DIFFERENTIAL - Abnormal; Notable for the following:    Hemoglobin 8.6 (*)    HCT 28.1 (*)    MCV 69.7 (*)    MCH 21.3 (*)    RDW 18.7 (*)    Platelets 512 (*)    All other components within normal limits  URINALYSIS, ROUTINE W REFLEX MICROSCOPIC - Abnormal; Notable for the following:    Color, Urine RED (*)    APPearance CLOUDY (*)    Hgb urine dipstick LARGE (*)    Protein, ur 30 (*)    Leukocytes, UA MODERATE (*)    All other components within normal limits  BASIC METABOLIC PANEL - Abnormal; Notable for the following:    Potassium 3.4 (*)    All other components within normal limits  URINE MICROSCOPIC-ADD ON - Abnormal; Notable for the following:    Bacteria, UA MANY (*)    All other components within normal limits  URINE CULTURE  TROPONIN I   Imaging Review No results found.  EKG Interpretation   None      Date: 02/15/2013  Rate: 84  Rhythm: normal sinus rhythm  QRS Axis: normal  Intervals: normal  ST/T Wave abnormalities: normal  Conduction Disutrbances:none  Narrative Interpretation:   Old EKG Reviewed: unchanged    MDM   1. Palpitations   2. Anxiety   3. Depression   4. Anemia    47 year old female with complaint of palpitations with stressful events.  Her workup here shows blood in her urine, patient currently on her menses.  She has anemia.  This is unchanged from prior.  Patient anxious and may have some depression.  I have suggested.  The patient.  She have follow up with her therapist to talk through her problems and find a better way to manage them.  She does not appear to have any acute medical problems that would require admission at this time.  She sees a local physician, Dr. Mikeal Hawthorne will refer her back to for further workup and treatment of her anemia.    Samantha Mackie, MD 02/16/13 [phone]

## 2013-02-19 ENCOUNTER — Emergency Department (HOSPITAL_COMMUNITY): Payer: BC Managed Care – PPO

## 2013-02-19 ENCOUNTER — Encounter (HOSPITAL_COMMUNITY): Payer: Self-pay | Admitting: Emergency Medicine

## 2013-02-19 ENCOUNTER — Emergency Department (HOSPITAL_COMMUNITY)
Admission: EM | Admit: 2013-02-19 | Discharge: 2013-02-20 | Disposition: A | Discharge status: Home / Self Care | Payer: BC Managed Care – PPO | Attending: Emergency Medicine | Admitting: Emergency Medicine

## 2013-02-19 DIAGNOSIS — R51 Headache: Secondary | ICD-10-CM | POA: Insufficient documentation

## 2013-02-19 DIAGNOSIS — R9431 Abnormal electrocardiogram [ECG] [EKG]: Secondary | ICD-10-CM | POA: Insufficient documentation

## 2013-02-19 DIAGNOSIS — I10 Essential (primary) hypertension: Secondary | ICD-10-CM | POA: Insufficient documentation

## 2013-02-19 DIAGNOSIS — Z79899 Other long term (current) drug therapy: Secondary | ICD-10-CM | POA: Insufficient documentation

## 2013-02-19 DIAGNOSIS — R002 Palpitations: Secondary | ICD-10-CM | POA: Insufficient documentation

## 2013-02-19 DIAGNOSIS — R079 Chest pain, unspecified: Secondary | ICD-10-CM | POA: Insufficient documentation

## 2013-02-19 DIAGNOSIS — Z8669 Personal history of other diseases of the nervous system and sense organs: Secondary | ICD-10-CM | POA: Insufficient documentation

## 2013-02-19 DIAGNOSIS — F411 Generalized anxiety disorder: Secondary | ICD-10-CM | POA: Insufficient documentation

## 2013-02-19 LAB — BASIC METABOLIC PANEL
CO2: 27 mEq/L (ref 19–32)
Chloride: 102 mEq/L (ref 96–112)
Creatinine, Ser: 0.7 mg/dL (ref 0.50–1.10)
Sodium: 137 mEq/L (ref 135–145)

## 2013-02-19 LAB — POCT I-STAT TROPONIN I: Troponin i, poc: 0 ng/mL (ref 0.00–0.08)

## 2013-02-19 LAB — CBC
HCT: 31.8 % — ABNORMAL LOW (ref 36.0–46.0)
MCH: 20.9 pg — ABNORMAL LOW (ref 26.0–34.0)
MCHC: 30.8 g/dL (ref 30.0–36.0)
Platelets: 694 10*3/uL — ABNORMAL HIGH (ref 150–400)
RDW: 18.4 % — ABNORMAL HIGH (ref 11.5–15.5)

## 2013-02-19 MED ORDER — NITROGLYCERIN 0.4 MG SL SUBL
0.4000 mg | SUBLINGUAL_TABLET | SUBLINGUAL | Status: DC | PRN
  Administered 2013-02-19: 0.4 mg via SUBLINGUAL

## 2013-02-19 MED ORDER — SODIUM CHLORIDE 0.9 % IV BOLUS (SEPSIS)
1000.0000 mL | Freq: Once | INTRAVENOUS | Status: AC
  Administered 2013-02-19: 1000 mL via INTRAVENOUS

## 2013-02-19 MED ORDER — ASPIRIN 81 MG PO CHEW
324.0000 mg | CHEWABLE_TABLET | Freq: Once | ORAL | Status: AC
  Administered 2013-02-19: 324 mg via ORAL
  Filled 2013-02-19: qty 4

## 2013-02-19 MED ORDER — LORAZEPAM 2 MG/ML IJ SOLN
1.0000 mg | Freq: Once | INTRAMUSCULAR | Status: AC
  Administered 2013-02-19: 1 mg via INTRAVENOUS
  Filled 2013-02-19: qty 1

## 2013-02-19 NOTE — ED Notes (Signed)
Pt taken to xray 

## 2013-02-19 NOTE — ED Notes (Signed)
CP starting today. Seen at [facility] 11/23 for same. PT unable to speak in complete sentences.

## 2013-02-19 NOTE — ED Notes (Addendum)
Pt states that she was prescribed xanax which she has taken three times today. Pt has also been taking melatonin to help her sleep but it has not been working. Pt asking if she can take Ambien (which is prescribed but she has not taken yet). Pt has many questions about her medications and has not started taking the Fluoxetine or Ambien that have been prescribed because she is nervous they will effect her heart. Pt very nervous about her medicine at bedside.

## 2013-02-19 NOTE — ED Provider Notes (Signed)
CSN: [id number]     Arrival date & time 02/19/13  2020 History   First MD Initiated Contact with Patient 02/19/13 2102     Chief Complaint  Patient presents with  . Chest Pain   (Consider location/radiation/quality/duration/timing/severity/associated sxs/prior Treatment) HPI Comments: Patient is a 47 year old female with a past medical history of anxiety and hypertension who presents with chest pain that started today. The chest pain has been intermittent throughout the day and is brought on by any stress or noise, according to the patient. The pain is sharp and severe and located in her left chest. The pain does not radiate and will last about 2 minutes before spontaneously resolving. Patient reports associated palpitations. Patient has seen her PCP and has been diagnosed with anxiety. She is supposed to be taking Xanax and Ambien but has not taken them because she is concerned about her heart. Patient reports SL nitro has helped with her pain previously. No other associated symptoms.   Patient is a 47 y.o. female presenting with chest pain.  Chest Pain Associated symptoms: palpitations   Associated symptoms: no abdominal pain, no dizziness, no dysphagia, no fatigue, no fever, no nausea, no shortness of breath, not vomiting and no weakness     Past Medical History  Diagnosis Date  . Migraine   . Hypertension   . Anxiety    History reviewed. No pertinent past surgical history. History reviewed. No pertinent family history. History  Substance Use Topics  . Smoking status: Never Smoker   . Smokeless tobacco: Not on file  . Alcohol Use: No   OB History   Grav Para Term Preterm Abortions TAB SAB Ect Mult Living                 Review of Systems  Constitutional: Negative for fever, chills and fatigue.  HENT: Negative for trouble swallowing.   Eyes: Negative for visual disturbance.  Respiratory: Negative for shortness of breath.   Cardiovascular: Positive for chest pain and  palpitations.  Gastrointestinal: Negative for nausea, vomiting, abdominal pain and diarrhea.  Genitourinary: Negative for dysuria and difficulty urinating.  Musculoskeletal: Negative for arthralgias and neck pain.  Skin: Negative for color change.  Neurological: Negative for dizziness and weakness.  Psychiatric/Behavioral: Negative for dysphoric mood.    Allergies  Pork-derived products  Home Medications   Current Outpatient Rx  Name  Route  Sig  Dispense  Refill  . ALPRAZolam (XANAX) 0.5 MG tablet   Oral   Take 0.5 mg by mouth 3 (three) times daily.          . ferrous sulfate 325 (65 FE) MG tablet   Oral   Take 1 tablet (325 mg total) by mouth daily.   30 tablet   0   . Melatonin 3 MG TABS   Oral   Take 1 tablet by mouth at bedtime as needed (for sleep).         . zolpidem (AMBIEN) 10 MG tablet                BP 129/87  Pulse 74  Temp(Src) 98.4 F (36.9 C) (Oral)  Resp 25  SpO2 100%  LMP 02/15/2013 Physical Exam  Nursing note and vitals reviewed. Constitutional: She is oriented to person, place, and time. She appears well-developed and well-nourished. No distress.  HENT:  Head: Normocephalic and atraumatic.  Eyes: Conjunctivae and EOM are normal.  Neck: Normal range of motion.  Cardiovascular: Normal rate and regular rhythm.  Exam reveals no gallop and no friction rub.   No murmur heard. Pulmonary/Chest: Effort normal and breath sounds normal. She has no wheezes. She has no rales. She exhibits no tenderness.  Abdominal: Soft. She exhibits no distension. There is no tenderness. There is no rebound and no guarding.  Musculoskeletal: Normal range of motion.  Neurological: She is alert and oriented to person, place, and time. Coordination normal.  Speech is goal-oriented. Moves limbs without ataxia.   Skin: Skin is warm and dry.  Psychiatric: Her behavior is normal.  Patient is very anxious.     ED Course  Procedures (including critical care  time) Labs Review Labs Reviewed  CBC - Abnormal; Notable for the following:    Hemoglobin 9.8 (*)    HCT 31.8 (*)    MCV 67.9 (*)    MCH 20.9 (*)    RDW 18.4 (*)    Platelets 694 (*)    All other components within normal limits  BASIC METABOLIC PANEL - Abnormal; Notable for the following:    Potassium 3.4 (*)    All other components within normal limits  POCT I-STAT TROPONIN I   Imaging Review Dg Chest 2 View  02/19/2013   CLINICAL DATA:  Chest pain.  EXAM: CHEST  2 VIEW  COMPARISON:  09/16/2012  FINDINGS: The heart size and mediastinal contours are within normal limits. Both lungs are clear. The visualized skeletal structures are unremarkable.  IMPRESSION: No active cardiopulmonary disease.   Electronically Signed   By: Myles Rosenthal M.D.   On: 02/19/2013 22:50    EKG Interpretation    Date/Time:  Thursday February 19 2013 20:25:41 EST Ventricular Rate:  91 PR Interval:  166 QRS Duration: 80 QT Interval:  346 QTC Calculation: 425 R Axis:   63 Text Interpretation:  Sinus rhythm with occasional Premature ventricular complexes ST abnormality, possible digitalis effect Abnormal QRS-T angle, consider primary T wave abnormality Abnormal ECG No significant change since last tracing Confirmed by SHELDON  MD, CHARLES ([id number]) on 02/19/2013 8:34:57 PM            MDM   1. Chest pain     10:41 PM Labs unremarkable for acute changes. First troponin negative and EKG shows no acute changes. Vitals stable and patient afebrile. Patient given nitro, aspirin and ativan.   12:41 AM Second troponin and chest xray negative for acute changes. Patient likely experiencing chest pain from anxiety and stress. Patient feels better after the ativan as now has a headache. I will treat the patient with toradol, reglan, and benadryl. Patient will be discharged with ativan to take as needed and instructions to follow up with her PCP. Vitals stable and patient afebrile.   Emilia Beck,  PA-C 02/20/13 0045

## 2013-02-20 MED ORDER — NITROGLYCERIN 0.4 MG SL SUBL: 0.4000 mg | SUBLINGUAL_TABLET | SUBLINGUAL | Status: DC | PRN

## 2013-02-20 MED ORDER — METOCLOPRAMIDE HCL 5 MG/ML IJ SOLN
10.0000 mg | Freq: Once | INTRAMUSCULAR | Status: AC
  Administered 2013-02-20: 10 mg via INTRAVENOUS
  Filled 2013-02-20: qty 2

## 2013-02-20 MED ORDER — KETOROLAC TROMETHAMINE 30 MG/ML IJ SOLN
30.0000 mg | Freq: Once | INTRAMUSCULAR | Status: AC
  Administered 2013-02-20: 30 mg via INTRAVENOUS
  Filled 2013-02-20: qty 1

## 2013-02-20 MED ORDER — DIPHENHYDRAMINE HCL 50 MG/ML IJ SOLN
25.0000 mg | Freq: Once | INTRAMUSCULAR | Status: AC
  Administered 2013-02-20: 25 mg via INTRAVENOUS
  Filled 2013-02-20: qty 1

## 2013-02-20 MED ORDER — LORAZEPAM 1 MG PO TABS: 1.0000 mg | ORAL_TABLET | Freq: Three times a day (TID) | ORAL | Status: DC | PRN

## 2013-02-22 ENCOUNTER — Emergency Department (HOSPITAL_COMMUNITY): Payer: BC Managed Care – PPO

## 2013-02-22 ENCOUNTER — Encounter (HOSPITAL_COMMUNITY): Payer: Self-pay | Admitting: Emergency Medicine

## 2013-02-22 ENCOUNTER — Emergency Department (HOSPITAL_COMMUNITY)
Admission: EM | Admit: 2013-02-22 | Discharge: 2013-02-22 | Disposition: A | Discharge status: Home / Self Care | Payer: BC Managed Care – PPO | Attending: Emergency Medicine | Admitting: Emergency Medicine

## 2013-02-22 DIAGNOSIS — Z79899 Other long term (current) drug therapy: Secondary | ICD-10-CM | POA: Insufficient documentation

## 2013-02-22 DIAGNOSIS — F411 Generalized anxiety disorder: Secondary | ICD-10-CM | POA: Insufficient documentation

## 2013-02-22 DIAGNOSIS — R079 Chest pain, unspecified: Secondary | ICD-10-CM

## 2013-02-22 DIAGNOSIS — R072 Precordial pain: Secondary | ICD-10-CM | POA: Insufficient documentation

## 2013-02-22 DIAGNOSIS — I10 Essential (primary) hypertension: Secondary | ICD-10-CM | POA: Insufficient documentation

## 2013-02-22 DIAGNOSIS — G8929 Other chronic pain: Secondary | ICD-10-CM | POA: Insufficient documentation

## 2013-02-22 DIAGNOSIS — G4700 Insomnia, unspecified: Secondary | ICD-10-CM | POA: Insufficient documentation

## 2013-02-22 DIAGNOSIS — Z8679 Personal history of other diseases of the circulatory system: Secondary | ICD-10-CM | POA: Insufficient documentation

## 2013-02-22 MED ORDER — SODIUM CHLORIDE 0.9 % IV SOLN
INTRAVENOUS | Status: DC
  Administered 2013-02-22: 06:00:00 via INTRAVENOUS

## 2013-02-22 MED ORDER — DEXAMETHASONE SODIUM PHOSPHATE 10 MG/ML IJ SOLN
10.0000 mg | Freq: Once | INTRAMUSCULAR | Status: AC
  Administered 2013-02-22: 10 mg via INTRAVENOUS
  Filled 2013-02-22: qty 1

## 2013-02-22 MED ORDER — METOCLOPRAMIDE HCL 5 MG/ML IJ SOLN
10.0000 mg | Freq: Once | INTRAMUSCULAR | Status: AC
  Administered 2013-02-22: 10 mg via INTRAVENOUS
  Filled 2013-02-22: qty 2

## 2013-02-22 MED ORDER — DIPHENHYDRAMINE HCL 50 MG/ML IJ SOLN
25.0000 mg | Freq: Once | INTRAMUSCULAR | Status: AC
  Administered 2013-02-22: 25 mg via INTRAVENOUS
  Filled 2013-02-22: qty 1

## 2013-02-22 MED ORDER — LORAZEPAM 1 MG PO TABS
1.0000 mg | ORAL_TABLET | Freq: Once | ORAL | Status: AC
Start: 2013-02-22 — End: 2013-02-22
  Administered 2013-02-22: 1 mg via ORAL
  Filled 2013-02-22: qty 1

## 2013-02-22 NOTE — ED Notes (Signed)
Pt arrived via EMS with a complaint of chest pain.  Pt is restless and anxious.  Pt seen for same at [facility] yesterday.  Pt states she wants "the medicine that makes me sleep"  Pt had a strip run by EMS which showed normal sinus rhythm.  Pt has dark circles underneath her eyes.

## 2013-02-22 NOTE — ED Provider Notes (Signed)
CSN: [id number]     Arrival date & time 02/22/13  0505 History   First MD Initiated Contact with Patient 02/22/13 [phone]     Chief Complaint  Patient presents with  . Chest Pain   (Consider location/radiation/quality/duration/timing/severity/associated sxs/prior Treatment) HPI History provided by patient. Ongoing substernal chest pain for months, sharp pain is worse at nights, has had multiple evaluations for this including last night.  She was recently diagnosed with anxiety by her primary care physician. She hasn't had a lot of trouble sleeping, as prescribed Zantac, Ativan, Ambien and also takes melatonin. Tonight he began having her same recurrent chest pains with difficulty sleeping and was brought in by EMS. She has a history of hypertension but no known history of heart disease. She expresses multiple stressors at home, has 3 children she cares for, her husband works overnight shifts and she is at home alone.  She denies any depression or suicidal ideation. She is not homicidal. She denies any new symptoms. Past Medical History  Diagnosis Date  . Migraine   . Hypertension   . Anxiety    History reviewed. No pertinent past surgical history. History reviewed. No pertinent family history. History  Substance Use Topics  . Smoking status: Never Smoker   . Smokeless tobacco: Not on file  . Alcohol Use: No   OB History   Grav Para Term Preterm Abortions TAB SAB Ect Mult Living                 Review of Systems  Constitutional: Negative for fever and chills.  Eyes: Negative for pain.  Respiratory: Negative for shortness of breath.   Cardiovascular: Positive for chest pain.  Gastrointestinal: Negative for abdominal pain.  Genitourinary: Negative for dysuria.  Musculoskeletal: Negative for back pain, neck pain and neck stiffness.  Skin: Negative for rash.  Neurological: Negative for headaches.  Psychiatric/Behavioral: The patient is nervous/anxious.   All other systems reviewed  and are negative.    Allergies  Pork-derived products  Home Medications   Current Outpatient Rx  Name  Route  Sig  Dispense  Refill  . ALPRAZolam (XANAX) 0.5 MG tablet   Oral   Take 0.5 mg by mouth 3 (three) times daily.          . ferrous sulfate 325 (65 FE) MG tablet   Oral   Take 1 tablet (325 mg total) by mouth daily.   30 tablet   0   . FLUoxetine (PROZAC) 20 MG tablet   Oral   Take 20 mg by mouth daily.         Marland Kitchen LORazepam (ATIVAN) 1 MG tablet   Oral   Take 1 tablet (1 mg total) by mouth 3 (three) times daily as needed for anxiety.   15 tablet   0   . Melatonin 3 MG TABS   Oral   Take 1 tablet by mouth at bedtime as needed (for sleep).         . nitroGLYCERIN (NITROSTAT) 0.4 MG SL tablet   Sublingual   Place 1 tablet (0.4 mg total) under the tongue every 5 (five) minutes as needed for chest pain.   12 tablet   0   . senna-docusate (SENOKOT-S) 8.6-50 MG per tablet   Oral   Take 1 tablet by mouth at bedtime as needed for mild constipation.         Marland Kitchen zolpidem (AMBIEN) 10 MG tablet  BP 125/76  Pulse 86  Temp(Src) 97.6 F (36.4 C) (Oral)  Resp 20  SpO2 99%  LMP 02/15/2013 Physical Exam  Constitutional: She is oriented to person, place, and time. She appears well-developed and well-nourished.  HENT:  Head: Normocephalic and atraumatic.  Eyes: EOM are normal. Pupils are equal, round, and reactive to light.  Neck: Neck supple.  Cardiovascular: Normal rate, regular rhythm and intact distal pulses.   Pulmonary/Chest: Effort normal and breath sounds normal. No respiratory distress. She exhibits no tenderness.  No rash. no Crepitus.  Abdominal: Soft. She exhibits no distension. There is no tenderness.  Musculoskeletal: Normal range of motion. She exhibits no edema.  Neurological: She is alert and oriented to person, place, and time.  Skin: Skin is warm and dry.  Psychiatric:  Anxious    ED Course  Procedures (including  critical care time) Labs Review Labs Reviewed - No data to display Imaging Review Dg Chest Portable 1 View  02/22/2013   CLINICAL DATA:  Chest pain, cough, weakness. History of hypertension.  EXAM: PORTABLE CHEST - 1 VIEW  COMPARISON:  02/19/2013  FINDINGS: The heart size and mediastinal contours are within normal limits. Both lungs are clear. The visualized skeletal structures are unremarkable. No significant changes since the previous study.  IMPRESSION: No active disease.   Electronically Signed   By: Burman Nieves M.D.   On: 02/22/2013 05:56    EKG Interpretation    Date/Time:  Sunday February 22 2013 05:35:37 EST Ventricular Rate:  78 PR Interval:  187 QRS Duration: 84 QT Interval:  384 QTC Calculation: 437 R Axis:   63 Text Interpretation:  Sinus rhythm Multiple ventricular premature complexes Nonspecific ST and T wave abnormality Abnormal ECG Confirmed by Khaliya Golinski  MD, Erbie Arment [phone]) on 02/22/2013 5:42:49 AM           Previous records reviewed  - has had cardiac ED workups for these symptoms including yesterday with serial troponins, EKG and chest x-ray. Repeat EKG is unchanged.  6:56 AM resting comfortably. Plan discharge home follow up as outpatient. I had a long discussion with patient about seeing a psychologist or psychiatrist regarding her multiple stressors. She is agreeable to this plan. She is not suicidal or homicidal is no indication for admission at this time.  She prefers to be discharged home as she has young children.   MDM  Diagnosis: Anxiety, insomnia, chronic chest pain  Given her high degree of anxiety, patient given Ativan.  On recheck complaining of headache and wanting something to help her sleep. Normal neuro exam. IV fluids and had a cocktail provided.  Chest x-ray and EKG reviewed as above Medications provided Vital signs, old records and nursing notes reviewed and considered    Sunnie Nielsen, MD 02/23/13 [phone]

## 2013-02-22 NOTE — ED Notes (Signed)
Pt has been given d/c instructions. Pt will wait in room until her husband picks her up d/t drowsiness and doesn't want to go to waiting room.

## 2013-02-22 NOTE — ED Notes (Signed)
Bed: ZH08 Expected date: 02/22/13 Expected time: 4:53 AM Means of arrival: Ambulance Comments: Bed 13, EMS, 47 F, Chest Pain/Anxiety

## 2013-02-23 NOTE — ED Provider Notes (Signed)
Medical screening examination/treatment/procedure(s) were performed by non-physician practitioner and as supervising physician I was immediately available for consultation/collaboration.  EKG Interpretation    Date/Time:  Thursday February 19 2013 20:25:41 EST Ventricular Rate:  91 PR Interval:  166 QRS Duration: 80 QT Interval:  346 QTC Calculation: 425 R Axis:   63 Text Interpretation:  Sinus rhythm with occasional Premature ventricular complexes ST abnormality, possible digitalis effect Abnormal QRS-T angle, consider primary T wave abnormality Abnormal ECG No significant change since last tracing Confirmed by SHELDON  MD, CHARLES [phone]) on 02/19/2013 8:34:57 PM              Charles B. Bernette Mayers, MD 02/23/13 [phone]

## 2013-04-20 ENCOUNTER — Emergency Department (HOSPITAL_COMMUNITY)
Admission: EM | Admit: 2013-04-20 | Discharge: 2013-04-20 | Discharge status: Against Medical Advice | Payer: BC Managed Care – PPO | Attending: Emergency Medicine | Admitting: Emergency Medicine

## 2013-04-20 ENCOUNTER — Encounter (HOSPITAL_COMMUNITY): Payer: Self-pay | Admitting: Emergency Medicine

## 2013-04-20 DIAGNOSIS — R079 Chest pain, unspecified: Secondary | ICD-10-CM | POA: Insufficient documentation

## 2013-04-20 DIAGNOSIS — I10 Essential (primary) hypertension: Secondary | ICD-10-CM | POA: Insufficient documentation

## 2013-04-20 LAB — POCT I-STAT TROPONIN I: Troponin i, poc: 0 ng/mL (ref 0.00–0.08)

## 2013-04-20 LAB — BASIC METABOLIC PANEL
BUN: 8 mg/dL (ref 6–23)
CO2: 24 mEq/L (ref 19–32)
Calcium: 8.7 mg/dL (ref 8.4–10.5)
Chloride: 103 mEq/L (ref 96–112)
Creatinine, Ser: 0.63 mg/dL (ref 0.50–1.10)
GFR calc Af Amer: >90 mL/min (ref >90)
Glucose, Bld: 97 mg/dL (ref 70–99)
POTASSIUM: 3.7 meq/L (ref 3.7–5.3)
Sodium: 140 mEq/L (ref 137–147)

## 2013-04-20 LAB — CBC
HEMATOCRIT: 30.7 % — AB (ref 36.0–46.0)
HEMOGLOBIN: 9.1 g/dL — AB (ref 12.0–15.0)
MCH: 20.3 pg — ABNORMAL LOW (ref 26.0–34.0)
MCHC: 29.6 g/dL — AB (ref 30.0–36.0)
MCV: 68.4 fL — ABNORMAL LOW (ref 78.0–100.0)
Platelets: 714 10*3/uL — ABNORMAL HIGH (ref 150–400)
RBC: 4.49 MIL/uL (ref 3.87–5.11)
RDW: 17.8 % — ABNORMAL HIGH (ref 11.5–15.5)
WBC: 8.4 10*3/uL (ref 4.0–10.5)

## 2013-04-20 NOTE — ED Notes (Signed)
Patient states she is here many times for the same chest pain

## 2013-04-26 ENCOUNTER — Encounter (HOSPITAL_COMMUNITY): Payer: Self-pay | Admitting: Emergency Medicine

## 2013-04-26 ENCOUNTER — Emergency Department (HOSPITAL_COMMUNITY)
Admission: EM | Admit: 2013-04-26 | Discharge: 2013-04-26 | Disposition: A | Discharge status: Home / Self Care | Payer: BC Managed Care – PPO | Attending: Emergency Medicine | Admitting: Emergency Medicine

## 2013-04-26 ENCOUNTER — Emergency Department (HOSPITAL_COMMUNITY): Payer: BC Managed Care – PPO

## 2013-04-26 DIAGNOSIS — R0789 Other chest pain: Secondary | ICD-10-CM

## 2013-04-26 DIAGNOSIS — R071 Chest pain on breathing: Secondary | ICD-10-CM | POA: Insufficient documentation

## 2013-04-26 DIAGNOSIS — Z79899 Other long term (current) drug therapy: Secondary | ICD-10-CM | POA: Insufficient documentation

## 2013-04-26 DIAGNOSIS — F411 Generalized anxiety disorder: Secondary | ICD-10-CM | POA: Insufficient documentation

## 2013-04-26 DIAGNOSIS — I10 Essential (primary) hypertension: Secondary | ICD-10-CM | POA: Insufficient documentation

## 2013-04-26 LAB — CBC
HCT: 31 % — ABNORMAL LOW (ref 36.0–46.0)
Hemoglobin: 9.5 g/dL — ABNORMAL LOW (ref 12.0–15.0)
MCH: 21 pg — ABNORMAL LOW (ref 26.0–34.0)
MCHC: 30.6 g/dL (ref 30.0–36.0)
MCV: 68.4 fL — AB (ref 78.0–100.0)
Platelets: 640 10*3/uL — ABNORMAL HIGH (ref 150–400)
RBC: 4.53 MIL/uL (ref 3.87–5.11)
RDW: 17.9 % — ABNORMAL HIGH (ref 11.5–15.5)
WBC: 6.4 10*3/uL (ref 4.0–10.5)

## 2013-04-26 LAB — POCT I-STAT TROPONIN I: Troponin i, poc: 0 ng/mL (ref 0.00–0.08)

## 2013-04-26 LAB — BASIC METABOLIC PANEL
BUN: 8 mg/dL (ref 6–23)
CHLORIDE: 103 meq/L (ref 96–112)
CO2: 25 meq/L (ref 19–32)
Calcium: 9 mg/dL (ref 8.4–10.5)
Creatinine, Ser: 0.63 mg/dL (ref 0.50–1.10)
GFR calc Af Amer: >90 mL/min (ref >90)
GFR calc non Af Amer: >90 mL/min (ref >90)
Glucose, Bld: 100 mg/dL — ABNORMAL HIGH (ref 70–99)
Potassium: 4.1 mEq/L (ref 3.7–5.3)
Sodium: 140 mEq/L (ref 137–147)

## 2013-04-26 LAB — D-DIMER, QUANTITATIVE: D-Dimer, Quant: 0.31 ug/mL-FEU (ref 0.00–0.48)

## 2013-04-26 MED ORDER — ACETAMINOPHEN 500 MG PO TABS
1000.0000 mg | ORAL_TABLET | Freq: Once | ORAL | Status: AC
  Administered 2013-04-26: 1000 mg via ORAL
  Filled 2013-04-26: qty 2

## 2013-04-26 NOTE — ED Provider Notes (Addendum)
CSN: [id number]     Arrival date & time 04/26/13  1740 History   First MD Initiated Contact with Patient 04/26/13 1949     Chief Complaint  Patient presents with  . Chest Pain   (Consider location/radiation/quality/duration/timing/severity/associated sxs/prior Treatment) HPI Complains of anterior chest pain left-sided nonradiating pleuritic onset approximately 10 hours ago. Pain worse with deep inspiration. Nonexertional. Associated symptoms include anxiety No other associated symptoms. No nausea. No cough no fever. No shortness of breath. Patient states she gets chest pain every other day for several years however it is different from today's pain. Past Medical History  Diagnosis Date  . Migraine   . Hypertension   . Anxiety    anemia History reviewed. No pertinent past surgical history. History reviewed. No pertinent family history. History  Substance Use Topics  . Smoking status: Never Smoker   . Smokeless tobacco: Not on file  . Alcohol Use: No   OB History   Grav Para Term Preterm Abortions TAB SAB Ect Mult Living                 Review of Systems  Constitutional: Negative.   HENT: Negative.   Respiratory: Negative.   Cardiovascular: Positive for chest pain.  Gastrointestinal: Negative.   Genitourinary:       As menstrual period 2 weeks ago  Musculoskeletal: Negative.   Skin: Negative.   Neurological: Negative.   Psychiatric/Behavioral: The patient is nervous/anxious.   All other systems reviewed and are negative.    Allergies  Pork-derived products  Home Medications   Current Outpatient Rx  Name  Route  Sig  Dispense  Refill  . ALPRAZolam (XANAX) 0.5 MG tablet   Oral   Take 0.5 mg by mouth 3 (three) times daily.          . ferrous sulfate 325 (65 FE) MG tablet   Oral   Take 1 tablet (325 mg total) by mouth daily.   30 tablet   0   . FLUoxetine (PROZAC) 20 MG tablet   Oral   Take 20 mg by mouth daily.         Marland Kitchen. LORazepam (ATIVAN) 1 MG tablet    Oral   Take 1 tablet (1 mg total) by mouth 3 (three) times daily as needed for anxiety.   15 tablet   0   . Melatonin 3 MG TABS   Oral   Take 1 tablet by mouth at bedtime as needed (for sleep).         . nitroGLYCERIN (NITROSTAT) 0.4 MG SL tablet   Sublingual   Place 1 tablet (0.4 mg total) under the tongue every 5 (five) minutes as needed for chest pain.   12 tablet   0   . senna-docusate (SENOKOT-S) 8.6-50 MG per tablet   Oral   Take 1 tablet by mouth at bedtime as needed for mild constipation.         Marland Kitchen. zolpidem (AMBIEN) 10 MG tablet                BP 124/68  Pulse 85  Temp(Src) 99 F (37.2 C) (Oral)  Resp 21  SpO2 100%  LMP 04/06/2013 Physical Exam  Nursing note and vitals reviewed. Constitutional: She appears well-developed and well-nourished.  HENT:  Head: Normocephalic and atraumatic.  Eyes: Conjunctivae are normal. Pupils are equal, round, and reactive to light.  Neck: Neck supple. No tracheal deviation present. No thyromegaly present.  Cardiovascular: Normal rate and regular rhythm.  Exam reveals no friction rub.   No murmur heard. Pulmonary/Chest: Effort normal and breath sounds normal.  Left chest wall tender, reproducing pain exactly  Abdominal: Soft. Bowel sounds are normal. She exhibits no distension. There is no tenderness.  Musculoskeletal: Normal range of motion. She exhibits no edema and no tenderness.  Neurological: She is alert. Coordination normal.  Skin: Skin is warm and dry. No rash noted.  Psychiatric: She has a normal mood and affect.    ED Course  Procedures (including critical care time) Labs Review Labs Reviewed  CBC - Abnormal; Notable for the following:    Hemoglobin 9.5 (*)    HCT 31.0 (*)    MCV 68.4 (*)    MCH 21.0 (*)    RDW 17.9 (*)    Platelets 640 (*)    All other components within normal limits  BASIC METABOLIC PANEL  POCT I-STAT TROPONIN I   Imaging Review No results found.  EKG Interpretation     Date/Time:  Sunday April 26 2013 17:43:32 EST Ventricular Rate:  90 PR Interval:  162 QRS Duration: 74 QT Interval:  338 QTC Calculation: 413 R Axis:   60 Text Interpretation:  Sinus rhythm with occasional Premature ventricular complexes T wave abnormality, consider inferior ischemia Abnormal ECG Since last tracing Nonspecific T wave abnormality Confirmed by SHELDON  MD, CHARLES ([id number]) on 04/26/2013 5:57:28 PM           chest x-ray viewed by me Results for orders placed during the hospital encounter of 04/26/13  CBC      Result Value Range   WBC 6.4  4.0 - 10.5 K/uL   RBC 4.53  3.87 - 5.11 MIL/uL   Hemoglobin 9.5 (*) 12.0 - 15.0 g/dL   HCT 40.9 (*) 81.1 - 91.4 %   MCV 68.4 (*) 78.0 - 100.0 fL   MCH 21.0 (*) 26.0 - 34.0 pg   MCHC 30.6  30.0 - 36.0 g/dL   RDW 78.2 (*) 95.6 - 21.3 %   Platelets 640 (*) 150 - 400 K/uL  BASIC METABOLIC PANEL      Result Value Range   Sodium 140  137 - 147 mEq/L   Potassium 4.1  3.7 - 5.3 mEq/L   Chloride 103  96 - 112 mEq/L   CO2 25  19 - 32 mEq/L   Glucose, Bld 100 (*) 70 - 99 mg/dL   BUN 8  6 - 23 mg/dL   Creatinine, Ser 0.86  0.50 - 1.10 mg/dL   Calcium 9.0  8.4 - 57.8 mg/dL   GFR calc non Af Amer >90  >90 mL/min   GFR calc Af Amer >90  >90 mL/min  D-DIMER, QUANTITATIVE      Result Value Range   D-Dimer, Quant 0.31  0.00 - 0.48 ug/mL-FEU  POCT I-STAT TROPONIN I      Result Value Range   Troponin i, poc 0.00  0.00 - 0.08 ng/mL   Comment 3            Dg Chest 2 View  04/26/2013   CLINICAL DATA:  Left-sided chest pain.  EXAM: CHEST  2 VIEW  COMPARISON:  PA and lateral chest 05/02/2012.  FINDINGS: Lungs are clear. Heart size is normal. No pneumothorax or pleural effusion. No focal bony abnormality.  IMPRESSION: Negative chest.   Electronically Signed   By: Drusilla Kanner M.D.   On: 04/26/2013 20:42    11:15 Pain improved after treatment with Tylenol. She states she feels  more relaxed MDM  No diagnosis found. Heart score =2 Chest  pain is chronic.. Likely large component of anxiety. Doubt acute coronary syndrome. Low pretest probability of pulmonary embolism. Negative d-dimer. Anemia is chronic Plan followup Dr. Mikeal Hawthorne Diagnosis atypical chest pain #2 anemia   Doug Sou, MD 04/26/13 2322  Doug Sou, MD 04/26/13 2324

## 2013-04-26 NOTE — ED Notes (Signed)
Pt wc to car. Pt alert x4 respirations easy non labored.

## 2013-04-26 NOTE — ED Notes (Signed)
States shes had sharp pain in L side of her chest since the morning, states shes been seen here for cp before but she doesn't know whats wrong. She is A&Ox4, resp e/u. Pt states she is anxious.

## 2013-04-26 NOTE — ED Notes (Signed)
Patient transported to X-ray 

## 2013-04-26 NOTE — Discharge Instructions (Signed)
Chest Pain (Nonspecific) Your blood work shows that your anemic with hemoglobin of 9.5. Call your primary care physician tomorrow for follow up to arrange to be seen within a week. Take Tylenol instructed for pain Chest pain has many causes. Your pain could be caused by something serious, such as a heart attack or a blood clot in the lungs. It could also be caused by something less serious, such as a chest bruise or a virus. Follow up with your doctor. More lab tests or other studies may be needed to find the cause of your pain. Most of the time, nonspecific chest pain will improve within 2 to 3 days of rest and mild pain medicine. HOME CARE  For chest bruises, you may put ice on the sore area for 15-20 minutes, 03-04 times a day. Do this only if it makes you feel better.  Put ice in a plastic bag.  Place a towel between the skin and the bag.  Rest for the next 2 to 3 days.  Go back to work if the pain improves.  See your doctor if the pain lasts longer than 1 to 2 weeks.  Only take medicine as told by your doctor.  Quit smoking if you smoke. GET HELP RIGHT AWAY IF:   There is more pain or pain that spreads to the arm, neck, jaw, back, or belly (abdomen).  You have shortness of breath.  You cough more than usual or cough up blood.  You have very bad back or belly pain, feel sick to your stomach (nauseous), or throw up (vomit).  You have very bad weakness.  You pass out (faint).  You have a fever. Any of these problems may be serious and may be an emergency. Do not wait to see if the problems will go away. Get medical help right away. Call your local emergency services 911 in [place] Do not drive yourself to the hospital. MAKE SURE YOU:   Understand these instructions.  Will watch this condition.  Will get help right away if you or your child is not doing well or gets worse. Document Released: 08/29/2007 Document Revised: 06/04/2011 Document Reviewed: 08/29/2007 [facility]  Patient Information 2014 [place], [place].

## 2013-05-25 ENCOUNTER — Ambulatory Visit (INDEPENDENT_AMBULATORY_CARE_PROVIDER_SITE_OTHER): Payer: BC Managed Care – PPO | Admitting: Surgery

## 2013-06-05 ENCOUNTER — Ambulatory Visit (INDEPENDENT_AMBULATORY_CARE_PROVIDER_SITE_OTHER): Payer: BC Managed Care – PPO | Admitting: Surgery

## 2013-06-11 ENCOUNTER — Encounter (INDEPENDENT_AMBULATORY_CARE_PROVIDER_SITE_OTHER): Payer: Self-pay | Admitting: Surgery

## 2013-09-06 ENCOUNTER — Encounter (HOSPITAL_COMMUNITY): Payer: Self-pay | Admitting: Emergency Medicine

## 2013-09-06 ENCOUNTER — Emergency Department (HOSPITAL_COMMUNITY)
Admission: EM | Admit: 2013-09-06 | Discharge: 2013-09-06 | Disposition: A | Discharge status: Home / Self Care | Payer: BC Managed Care – PPO | Attending: Emergency Medicine | Admitting: Emergency Medicine

## 2013-09-06 DIAGNOSIS — R067 Sneezing: Secondary | ICD-10-CM

## 2013-09-06 DIAGNOSIS — I10 Essential (primary) hypertension: Secondary | ICD-10-CM | POA: Insufficient documentation

## 2013-09-06 DIAGNOSIS — R0981 Nasal congestion: Secondary | ICD-10-CM

## 2013-09-06 DIAGNOSIS — Z79899 Other long term (current) drug therapy: Secondary | ICD-10-CM | POA: Insufficient documentation

## 2013-09-06 DIAGNOSIS — F411 Generalized anxiety disorder: Secondary | ICD-10-CM | POA: Insufficient documentation

## 2013-09-06 DIAGNOSIS — R6889 Other general symptoms and signs: Secondary | ICD-10-CM | POA: Insufficient documentation

## 2013-09-06 DIAGNOSIS — H9209 Otalgia, unspecified ear: Secondary | ICD-10-CM | POA: Insufficient documentation

## 2013-09-06 DIAGNOSIS — J3489 Other specified disorders of nose and nasal sinuses: Secondary | ICD-10-CM | POA: Insufficient documentation

## 2013-09-06 DIAGNOSIS — R110 Nausea: Secondary | ICD-10-CM | POA: Insufficient documentation

## 2013-09-06 MED ORDER — FLUTICASONE PROPIONATE 50 MCG/ACT NA SUSP: 1.0000 | Freq: Every day | NASAL | Status: DC

## 2013-09-06 MED ORDER — CETIRIZINE HCL 10 MG PO TABS: 10.0000 mg | ORAL_TABLET | Freq: Every day | ORAL | Status: DC

## 2013-09-06 NOTE — ED Provider Notes (Signed)
Medical screening examination/treatment/procedure(s) were performed by non-physician practitioner and as supervising physician I was immediately available for consultation/collaboration.   EKG Interpretation None       Aixa Corsello, MD 09/06/13 1754 

## 2013-09-06 NOTE — ED Notes (Signed)
Pt refused wheelchair, escorted out by RN. 

## 2013-09-06 NOTE — Discharge Instructions (Signed)
Take the prescribed medication as directed. °Follow-up with your primary care physician. °Return to the ED for new or worsening symptoms. ° °

## 2013-09-06 NOTE — ED Notes (Signed)
Pt is here with congestion for 10 days and states she has some nausea.  Pt reports nasal congestion, sneezing, and not able to sleep at night.  Pt reports body aches all over.  No vomiting or diarrhea

## 2013-09-06 NOTE — ED Provider Notes (Signed)
CSN: [id number]     Arrival date & time 09/06/13  1317 History   First MD Initiated Contact with Patient 09/06/13 1435    This chart was scribed for Sharilyn SitesLisa Jennah Satchell PA-C, a non-physician practitioner working with Doug SouSam Jacubowitz, MD by Lewanda RifeAlexandra Hurtado, ED Scribe. This patient was seen in room TR05C/TR05C and the patient's care was started at 3:09 PM     Chief Complaint  Patient presents with  . Nasal Congestion  . Nausea     (Consider location/radiation/quality/duration/timing/severity/associated sxs/prior Treatment) The history is provided by the patient. No language interpreter was used.   HPI Comments: Samantha Logan is a 48 y.o. female who presents to the Emergency Department complaining of constant nasal congestion onset 10 days. Describes symptoms as gradually worsening in severity. Reports associated nausea, sneezing, insomnia due to symptoms, generalized myalgias, headaches, and bilateral otalgia. Reports trying tylenol and Advil with no relief of symptoms. Denies any aggravating factors. Denies associated emesis, diarrhea, fever, cough, and sick contacts.  No chest pain, palpitations, or SOB.  VS stable on arrival.  Past Medical History  Diagnosis Date  . Migraine   . Hypertension   . Anxiety    Past Surgical History  Procedure Laterality Date  . Cesarean section     No family history on file. History  Substance Use Topics  . Smoking status: Never Smoker   . Smokeless tobacco: Not on file  . Alcohol Use: No   OB History   Grav Para Term Preterm Abortions TAB SAB Ect Mult Living                 Review of Systems  Constitutional: Negative for fever.  HENT: Positive for congestion, ear pain and rhinorrhea.   Gastrointestinal: Positive for nausea.  All other systems reviewed and are negative.     Allergies  Pork-derived products  Home Medications   Prior to Admission medications   Medication Sig Start Date End Date Taking? Authorizing Provider  FLUoxetine  (PROZAC) 20 MG tablet Take 20 mg by mouth daily.    Historical Provider, MD  Melatonin 3 MG TABS Take 1 tablet by mouth at bedtime as needed (for sleep).    Historical Provider, MD  nitroGLYCERIN (NITROSTAT) 0.4 MG SL tablet Place 1 tablet (0.4 mg total) under the tongue every 5 (five) minutes as needed for chest pain. 02/20/13   Kaitlyn Szekalski, PA-C  senna-docusate (SENOKOT-S) 8.6-50 MG per tablet Take 1 tablet by mouth at bedtime as needed for mild constipation.    Historical Provider, MD  zolpidem (AMBIEN) 10 MG tablet  02/17/13   Historical Provider, MD   BP 126/72  Pulse 99  Temp(Src) 97.4 F (36.3 C) (Oral)  Resp 18  SpO2 99%  Physical Exam  Nursing note and vitals reviewed. Constitutional: She is oriented to person, place, and time. She appears well-developed and well-nourished. No distress.  HENT:  Head: Normocephalic and atraumatic.  Right Ear: Tympanic membrane, external ear and ear canal normal.  Left Ear: Tympanic membrane, external ear and ear canal normal.  Nose: Mucosal edema present. Right sinus exhibits no maxillary sinus tenderness and no frontal sinus tenderness. Left sinus exhibits no maxillary sinus tenderness and no frontal sinus tenderness.  Mouth/Throat: Uvula is midline, oropharynx is clear and moist and mucous membranes are normal. No oropharyngeal exudate, posterior oropharyngeal edema, posterior oropharyngeal erythema or tonsillar abscesses.  Tonsils normal in appearance bilaterally without exudate; uvula midline; no peritonsillar abscess; handling secretions appropriately  Eyes: Conjunctivae and EOM  are normal.  Neck: Normal range of motion.  Cardiovascular: Normal rate.   Pulmonary/Chest: Effort normal and breath sounds normal. No respiratory distress. She has no wheezes. She has no rhonchi.  Respirations unlabored, no wheezes or rhonchi  Musculoskeletal: Normal range of motion.  Neurological: She is alert and oriented to person, place, and time.  Skin:  Skin is warm and dry.  Psychiatric: She has a normal mood and affect. Her behavior is normal.    ED Course  Procedures (including critical care time) COORDINATION OF CARE:  Nursing notes reviewed. Vital signs reviewed. Initial pt interview and examination performed.   Filed Vitals:   09/06/13 1331  BP: 126/72  Pulse: 99  Temp: 97.4 F (36.3 C)  TempSrc: Oral  Resp: 18  SpO2: 99%    3:18 PM-Discussed work up plan with pt at bedside, which includes No orders of the defined types were placed in this encounter.  . Pt agrees with plan.   Treatment plan initiated:Medications - No data to display   Initial diagnostic testing ordered.      Labs Review Labs Reviewed - No data to display  Imaging Review No results found.   EKG Interpretation None      MDM   Final diagnoses:  Nasal congestion  Sneezing   Exam is non-infectious.  No chest pain or SOB to suggest ACS, doubt PE as pt is PERC negative.  Lungs are clear.  Sx likely viral with allergic component.  Pt started on zyrtec and flonase.  She will FU with her PCP.  Discussed plan with patient, he/she acknowledged understanding and agreed with plan of care.  Return precautions given for new or worsening symptoms.  I personally performed the services described in this documentation, which was scribed in my presence. The recorded information has been reviewed and is accurate.   Garlon HatchetLisa M Cree Napoli, PA-C 09/06/13 1557

## 2013-10-02 ENCOUNTER — Encounter (HOSPITAL_COMMUNITY): Payer: Self-pay | Admitting: Emergency Medicine

## 2013-10-02 ENCOUNTER — Emergency Department (HOSPITAL_COMMUNITY)
Admission: EM | Admit: 2013-10-02 | Discharge: 2013-10-02 | Disposition: A | Discharge status: Home / Self Care | Payer: BC Managed Care – PPO | Attending: Emergency Medicine | Admitting: Emergency Medicine

## 2013-10-02 DIAGNOSIS — H9209 Otalgia, unspecified ear: Secondary | ICD-10-CM | POA: Insufficient documentation

## 2013-10-02 DIAGNOSIS — Z79899 Other long term (current) drug therapy: Secondary | ICD-10-CM | POA: Insufficient documentation

## 2013-10-02 DIAGNOSIS — R509 Fever, unspecified: Secondary | ICD-10-CM | POA: Insufficient documentation

## 2013-10-02 DIAGNOSIS — Z8659 Personal history of other mental and behavioral disorders: Secondary | ICD-10-CM | POA: Insufficient documentation

## 2013-10-02 DIAGNOSIS — J029 Acute pharyngitis, unspecified: Secondary | ICD-10-CM | POA: Insufficient documentation

## 2013-10-02 DIAGNOSIS — I10 Essential (primary) hypertension: Secondary | ICD-10-CM | POA: Insufficient documentation

## 2013-10-02 DIAGNOSIS — J069 Acute upper respiratory infection, unspecified: Secondary | ICD-10-CM | POA: Insufficient documentation

## 2013-10-02 LAB — RAPID STREP SCREEN (MED CTR MEBANE ONLY): STREPTOCOCCUS, GROUP A SCREEN (DIRECT): NEGATIVE

## 2013-10-02 MED ORDER — OXYMETAZOLINE HCL 0.05 % NA SOLN: 1.0000 | Freq: Two times a day (BID) | NASAL | Status: DC | PRN

## 2013-10-02 MED ORDER — PSEUDOEPHEDRINE HCL 60 MG PO TABS: 60.0000 mg | ORAL_TABLET | Freq: Four times a day (QID) | ORAL | Status: DC | PRN

## 2013-10-02 MED ORDER — IBUPROFEN 800 MG PO TABS: 800.0000 mg | ORAL_TABLET | Freq: Three times a day (TID) | ORAL | Status: DC | PRN

## 2013-10-02 NOTE — ED Notes (Signed)
Pt. reports nasal congestion , runny nose and sneezing for several days , denies cough or fever .

## 2013-10-02 NOTE — ED Provider Notes (Signed)
CSN: [id number]     Arrival date & time 10/02/13  2039 History  This chart was scribed for Trixie Dredge, PA-C working with Glynn Octave, MD by Evon Slack, ED Scribe. This patient was seen in room TR05C/TR05C and the patient's care was started at 10:19 PM.    Chief Complaint  Patient presents with  . Nasal Congestion   The history is provided by the patient. No language interpreter was used.   HPI Comments: Samantha Logan is a 48 y.o. female who presents to the Emergency Department complaining of nasal congestion onset 8-9 days prior. She states she has associated rhinorrhea, sneezing, sore throat, subjective fever, and ear pain. States she has not been around any recent sick contacts. No recent travel. She states she hasn't taken any medication prior to arrival. She states she has a appointment with her PCP on august 3rd. She denies cough, fever, abdominal pain, vomiting, or diarrhea.  Past Medical History  Diagnosis Date  . Migraine   . Hypertension   . Anxiety    Past Surgical History  Procedure Laterality Date  . Cesarean section     No family history on file. History  Substance Use Topics  . Smoking status: Never Smoker   . Smokeless tobacco: Not on file  . Alcohol Use: No   OB History   Grav Para Term Preterm Abortions TAB SAB Ect Mult Living                 Review of Systems  Constitutional: Positive for fever.  HENT: Positive for congestion, ear pain, rhinorrhea, sneezing and sore throat.   Gastrointestinal: Negative for vomiting, abdominal pain and diarrhea.  All other systems reviewed and are negative.   Allergies  Pork-derived products  Home Medications   Prior to Admission medications   Medication Sig Start Date End Date Taking? Authorizing Provider  cetirizine (ZYRTEC) 10 MG tablet Take 10 mg by mouth daily.   Yes Historical Provider, MD  FLUoxetine (PROZAC) 20 MG tablet Take 20 mg by mouth daily.   Yes Historical Provider, MD  fluticasone (FLONASE)  50 MCG/ACT nasal spray Place 1 spray into both nostrils daily.   Yes Historical Provider, MD  Melatonin 3 MG TABS Take 1 tablet by mouth at bedtime as needed (for sleep).   Yes Historical Provider, MD  nitroGLYCERIN (NITROSTAT) 0.4 MG SL tablet Place 0.4 mg under the tongue every 5 (five) minutes as needed for chest pain.   Yes Historical Provider, MD  zolpidem (AMBIEN) 10 MG tablet Take 10 mg by mouth at bedtime as needed for sleep.  02/17/13  Yes Historical Provider, MD  senna-docusate (SENOKOT-S) 8.6-50 MG per tablet Take 1 tablet by mouth at bedtime as needed for mild constipation.    Historical Provider, MD   Triage Vitals: BP 121/69  Pulse 88  Temp(Src) 98.5 F (36.9 C) (Oral)  Resp 14  Wt 209 lb (94.802 kg)  SpO2 100%  LMP 09/12/2013  Physical Exam  Nursing note and vitals reviewed. Constitutional: She appears well-developed and well-nourished. No distress.  HENT:  Head: Normocephalic and atraumatic.  Right Ear: Tympanic membrane and ear canal normal.  Left Ear: Tympanic membrane and ear canal normal.  Mouth/Throat: Uvula is midline. Mucous membranes are not dry. Posterior oropharyngeal erythema present. No oropharyngeal exudate, posterior oropharyngeal edema or tonsillar abscesses.  Swelling of left nasal turbinate, mild bilateral tenderness to maxillary sinus  Eyes: Conjunctivae are normal.  Neck: Neck supple.  Cardiovascular: Normal rate and regular  rhythm.   Pulmonary/Chest: Effort normal and breath sounds normal. No respiratory distress. She has no wheezes. She has no rales.  Lymphadenopathy:    She has no cervical adenopathy.  Neurological: She is alert.  Skin: She is not diaphoretic.    ED Course  Procedures (including critical care time) DIAGNOSTIC STUDIES: Oxygen Saturation is 100% on RA, normal by my interpretation.    COORDINATION OF CARE:    Labs Review Labs Reviewed  RAPID STREP SCREEN  CULTURE, GROUP A STREP    Imaging Review No results  found.   EKG Interpretation None      MDM   Final diagnoses:  URI (upper respiratory infection)    Afebrile, nontoxic patient with constellation of symptoms suggestive of viral syndrome.  No concerning findings on exam.  Discharged home with supportive care, PCP follow up.  Discussed findings, treatment, and follow up  with patient.  Pt given return precautions.  Pt verbalizes understanding and agrees with plan.       I personally performed the services described in this documentation, which was scribed in my presence. The recorded information has been reviewed and is accurate.      LigonierEmily Jazzmine Kleiman, PA-C 10/03/13 [phone]

## 2013-10-02 NOTE — Discharge Instructions (Signed)
Read the information below.  Use the prescribed medication as directed.  Please discuss all new medications with your pharmacist.  You may return to the Emergency Department at any time for worsening condition or any new symptoms that concern you.  If you develop high fevers that do not resolve with tylenol or ibuprofen, you have difficulty swallowing or breathing, or you are unable to tolerate fluids by mouth, return to the ER for a recheck.      Upper Respiratory Infection, Adult An upper respiratory infection (URI) is also known as the common cold. It is often caused by a type of germ (virus). Colds are easily spread (contagious). You can pass it to others by kissing, coughing, sneezing, or drinking out of the same glass. Usually, you get better in 1 or 2 weeks.  HOME CARE   Only take medicine as told by your doctor.  Use a warm mist humidifier or breathe in steam from a hot shower.  Drink enough water and fluids to keep your pee (urine) clear or pale yellow.  Get plenty of rest.  Return to work when your temperature is back to normal or as told by your doctor. You may use a face mask and wash your hands to stop your cold from spreading. GET HELP RIGHT AWAY IF:   After the first few days, you feel you are getting worse.  You have questions about your medicine.  You have chills, shortness of breath, or brown or red spit (mucus).  You have yellow or brown snot (nasal discharge) or pain in the face, especially when you bend forward.  You have a fever, puffy (swollen) neck, pain when you swallow, or white spots in the back of your throat.  You have a bad headache, ear pain, sinus pain, or chest pain.  You have a high-pitched whistling sound when you breathe in and out (wheezing).  You have a lasting cough or cough up blood.  You have sore muscles or a stiff neck. MAKE SURE YOU:   Understand these instructions.  Will watch your condition.  Will get help right away if you are  not doing well or get worse. Document Released: 08/29/2007 Document Revised: 06/04/2011 Document Reviewed: 07/17/2010 [facility] Patient Information 2015 [place]. This information is not intended to replace advice given to you by your health care provider. Make sure you discuss any questions you have with your health care provider.

## 2013-10-03 NOTE — ED Provider Notes (Signed)
Medical screening examination/treatment/procedure(s) were performed by non-physician practitioner and as supervising physician I was immediately available for consultation/collaboration.   EKG Interpretation None        Andera Cranmer, MD 10/03/13 0105 

## 2013-10-04 LAB — CULTURE, GROUP A STREP

## 2014-01-15 ENCOUNTER — Encounter (HOSPITAL_COMMUNITY): Payer: Self-pay | Admitting: Emergency Medicine

## 2014-01-15 ENCOUNTER — Emergency Department (HOSPITAL_COMMUNITY): Payer: BC Managed Care – PPO

## 2014-01-15 ENCOUNTER — Emergency Department (HOSPITAL_COMMUNITY)
Admission: EM | Admit: 2014-01-15 | Discharge: 2014-01-16 | Disposition: A | Discharge status: Home / Self Care | Payer: BC Managed Care – PPO | Attending: Emergency Medicine | Admitting: Emergency Medicine

## 2014-01-15 DIAGNOSIS — F419 Anxiety disorder, unspecified: Secondary | ICD-10-CM | POA: Insufficient documentation

## 2014-01-15 DIAGNOSIS — L729 Follicular cyst of the skin and subcutaneous tissue, unspecified: Secondary | ICD-10-CM | POA: Insufficient documentation

## 2014-01-15 DIAGNOSIS — R05 Cough: Secondary | ICD-10-CM | POA: Diagnosis not present

## 2014-01-15 DIAGNOSIS — Z79899 Other long term (current) drug therapy: Secondary | ICD-10-CM | POA: Insufficient documentation

## 2014-01-15 DIAGNOSIS — R053 Chronic cough: Secondary | ICD-10-CM

## 2014-01-15 DIAGNOSIS — I10 Essential (primary) hypertension: Secondary | ICD-10-CM | POA: Insufficient documentation

## 2014-01-15 DIAGNOSIS — Z7951 Long term (current) use of inhaled steroids: Secondary | ICD-10-CM | POA: Insufficient documentation

## 2014-01-15 DIAGNOSIS — R51 Headache: Secondary | ICD-10-CM | POA: Insufficient documentation

## 2014-01-15 DIAGNOSIS — L720 Epidermal cyst: Secondary | ICD-10-CM

## 2014-01-15 MED ORDER — ALPRAZOLAM 0.5 MG PO TABS: 0.5000 mg | ORAL_TABLET | Freq: Every evening | ORAL | Status: DC | PRN

## 2014-01-15 NOTE — ED Notes (Signed)
Pt reports chest pain x1 week.  Pt is actively coughing. Pt reports SOB, lack of appetite.  Pt reports anxiety about having three kids and " i stress myself."  Pt reports taking a tablet to help relax but is unable to remember name of medication.

## 2014-01-15 NOTE — ED Provider Notes (Signed)
CSN: [id number]     Arrival date & time 01/15/14  2122 History  This chart was scribed for non-physician practitioner, Felicie Morn, NP-C working with Elwin Mocha, MD by Luisa Dago, ED scribe. This patient was seen in room TR08C/TR08C and the patient's care was started at 10:40 PM.    Chief Complaint  Patient presents with  . Anxiety  . Headache  . Back Pain   Patient is a 48 y.o. female presenting with anxiety and headaches. The history is provided by the patient. No language interpreter was used.  Anxiety This is a recurrent problem. The current episode started 3 to 5 hours ago. The problem occurs constantly. The problem has been gradually worsening. Associated symptoms include headaches. Pertinent negatives include no shortness of breath. Nothing aggravates the symptoms. Nothing relieves the symptoms. She has tried nothing for the symptoms.  Headache Pain location:  Generalized Quality:  Dull Radiates to:  Does not radiate Severity currently:  5/10 Onset quality:  Gradual Progression:  Unchanged Chronicity:  Recurrent Similar to prior headaches: yes   Context: emotional stress   Relieved by:  Nothing Associated symptoms: cough   Associated symptoms: no diarrhea, no dizziness, no fatigue, no fever, no nausea, no sore throat and no vomiting    HPI Comments: Samantha Logan is a 48 y.o. female with a hx of Anxiety, Migraines, and HTN presents to the Emergency Department complaining of worsening anxiety that started 1 week ago. She states that she has been under a lot of stress. Pt is also complaining of associated headache, chest pain, and cough. She denies any fever, chills, nausea, emesis, abdominal pain, SI, or HI.   Past Medical History  Diagnosis Date  . Migraine   . Hypertension   . Anxiety    Past Surgical History  Procedure Laterality Date  . Cesarean section     History reviewed. No pertinent family history. History  Substance Use Topics  . Smoking status: Never  Smoker   . Smokeless tobacco: Not on file  . Alcohol Use: No   OB History   Grav Para Term Preterm Abortions TAB SAB Ect Mult Living                 Review of Systems  Constitutional: Negative for fever, chills, diaphoresis, appetite change and fatigue.  HENT: Negative for mouth sores, sore throat and trouble swallowing.   Eyes: Negative for visual disturbance.  Respiratory: Positive for cough. Negative for chest tightness, shortness of breath and wheezing.   Gastrointestinal: Negative for nausea, vomiting, diarrhea and abdominal distention.  Endocrine: Negative for polydipsia, polyphagia and polyuria.  Genitourinary: Negative for frequency and hematuria.  Musculoskeletal: Negative for gait problem.  Skin: Negative for color change, pallor and rash.  Neurological: Positive for headaches. Negative for dizziness, syncope and light-headedness.  Hematological: Does not bruise/bleed easily.  Psychiatric/Behavioral: Negative for behavioral problems and confusion.  All other systems reviewed and are negative.     Allergies  Pork-derived products  Home Medications   Prior to Admission medications   Medication Sig Start Date End Date Taking? Authorizing Provider  cetirizine (ZYRTEC) 10 MG tablet Take 10 mg by mouth daily.    Historical Provider, MD  FLUoxetine (PROZAC) 20 MG tablet Take 20 mg by mouth daily.    Historical Provider, MD  fluticasone (FLONASE) 50 MCG/ACT nasal spray Place 1 spray into both nostrils daily.    Historical Provider, MD  ibuprofen (ADVIL,MOTRIN) 800 MG tablet Take 1 tablet (800 mg total)  by mouth every 8 (eight) hours as needed for fever, mild pain or moderate pain. 10/02/13   Trixie DredgeEmily West, PA-C  Melatonin 3 MG TABS Take 1 tablet by mouth at bedtime as needed (for sleep).    Historical Provider, MD  nitroGLYCERIN (NITROSTAT) 0.4 MG SL tablet Place 0.4 mg under the tongue every 5 (five) minutes as needed for chest pain.    Historical Provider, MD  oxymetazoline  (AFRIN NASAL SPRAY) 0.05 % nasal spray Place 1 spray into both nostrils 2 (two) times daily as needed for congestion. 10/02/13   Trixie DredgeEmily West, PA-C  pseudoephedrine (SUDAFED) 60 MG tablet Take 1 tablet (60 mg total) by mouth every 6 (six) hours as needed for congestion. 10/02/13   Trixie DredgeEmily West, PA-C  senna-docusate (SENOKOT-S) 8.6-50 MG per tablet Take 1 tablet by mouth at bedtime as needed for mild constipation.    Historical Provider, MD  zolpidem (AMBIEN) 10 MG tablet Take 10 mg by mouth at bedtime as needed for sleep.  02/17/13   Historical Provider, MD   Triage vitals:BP 137/71  Pulse 86  Temp(Src) 99.2 F (37.3 C) (Oral)  Resp 16  Wt 215 lb 4 oz (97.637 kg)  SpO2 100%  LMP 01/14/2014  Physical Exam  Nursing note and vitals reviewed. Constitutional: She appears well-developed and well-nourished. No distress.  Appears anxious and stressed about life events.  HENT:  Head: Normocephalic and atraumatic.  Mouth/Throat: Oropharynx is clear and moist.  Eyes: Conjunctivae are normal. Right eye exhibits no discharge. Left eye exhibits no discharge.  Neck: Normal range of motion. Neck supple.  Cardiovascular: Normal rate, regular rhythm and normal heart sounds.  Exam reveals no gallop and no friction rub.   No murmur heard. Pulmonary/Chest: Effort normal and breath sounds normal. No respiratory distress. She has no wheezes.  Abdominal: Soft. She exhibits no distension. There is no tenderness.  Musculoskeletal: She exhibits no edema and no tenderness.  Neurological: She is alert.  Skin: Skin is warm and dry.  Psychiatric: She has a normal mood and affect. Her behavior is normal. Thought content normal.    ED Course  At time of discharge, patient asks for evaluation of a cyst on her back.  Area of induration 6x4 cm without fluctuance noted on left posterior chest wall below scapula.   Procedures (including critical care time)  Bedside ultrasound of cyst on back performed by Dr.  Jodi MourningZavitz.  Apiration of blood/fluid Performed by: Jimmye NormanSMITH,Tyris Eliot JOHN Consent obtained. Required items: required devices, and special equipment available Patient identity confirmed: verbally with patient Time out: Immediately prior to procedure a "time out" was called to verify the correct patient, procedure, equipment, support staff and site/side marked as required. Preparation: Patient was prepped and draped in the usual sterile fashion. Patient tolerance: Patient tolerated the procedure well with no immediate complications.  Location of aspiration: posterior chest wall  Needle aspiration of fluid from cyst.   DIAGNOSTIC STUDIES: Oxygen Saturation is 100% on RA, normal by my interpretation.    COORDINATION OF CARE: 10:45 PM- Pt advised of plan for treatment and pt agrees.  Imaging Review Dg Chest 2 View  01/15/2014   CLINICAL DATA:  Increasing anxiety starting 1 week ago. Headache, chest pain, and cough.  EXAM: CHEST  2 VIEW  COMPARISON:  12/24/2013  FINDINGS: The heart size and mediastinal contours are within normal limits. Both lungs are clear. The visualized skeletal structures are unremarkable.  IMPRESSION: No active cardiopulmonary disease.   Electronically Signed   By: Chrissie NoaWilliam  Andria MeuseStevens M.D.   On: 01/15/2014 23:50     EKG Interpretation None      Date: 01/15/2014  Rate: 75   Rhythm: normal sinus rhythm  QRS Axis: normal  Intervals: normal  ST/T Wave abnormalities: nonspecific ST changes  Conduction Disutrbances:none  Narrative Interpretation: NSR,, non-specific ST changes  Old EKG Reviewed: unchanged   MDM   Final diagnoses:  None    Anxiety. Persistent cough. Epidermal cyst on back.  I personally performed the services described in this documentation, which was scribed in my presence. The recorded information has been reviewed and is accurate.    Jimmye Normanavid John Haizel Gatchell, NP 01/16/14 [phone]

## 2014-01-15 NOTE — ED Notes (Signed)
EKG given to EDP.  

## 2014-01-15 NOTE — ED Notes (Addendum)
Pt reports generalized body aches, headache, and difficulty sleeping for one week. Pt denies n/v. Pt was seen by PCP two weeks ago for sore throat. Pt states she needs something to help her relax and deal with stress. Pt denies dysuria or constipation. Last BM today and normal.

## 2014-01-16 MED ORDER — LIDOCAINE HCL (PF) 1 % IJ SOLN
5.0000 mL | Freq: Once | INTRAMUSCULAR | Status: AC
  Administered 2014-01-16: 5 mL via INTRADERMAL
  Filled 2014-01-16: qty 5

## 2014-01-16 NOTE — ED Notes (Addendum)
While discharging pt. Pt begins to c/o of area on back that is indurated and tender. Onalee Huaavid, NP notified.

## 2014-01-16 NOTE — ED Notes (Signed)
md at bedside to US area on back.

## 2014-01-16 NOTE — Discharge Instructions (Signed)
Cough, Adult  A cough is a reflex that helps clear your throat and airways. It can help heal the body or may be a reaction to an irritated airway. A cough may only last 2 or 3 weeks (acute) or may last more than 8 weeks (chronic).  CAUSES Acute cough:  Viral or bacterial infections. Chronic cough:  Infections.  Allergies.  Asthma.  Post-nasal drip.  Smoking.  Heartburn or acid reflux.  Some medicines.  Chronic lung problems (COPD).  Cancer. SYMPTOMS   Cough.  Fever.  Chest pain.  Increased breathing rate.  High-pitched whistling sound when breathing (wheezing).  Colored mucus that you cough up (sputum). TREATMENT   A bacterial cough may be treated with antibiotic medicine.  A viral cough must run its course and will not respond to antibiotics.  Your caregiver may recommend other treatments if you have a chronic cough. HOME CARE INSTRUCTIONS   Only take over-the-counter or prescription medicines for pain, discomfort, or fever as directed by your caregiver. Use cough suppressants only as directed by your caregiver.  Use a cold steam vaporizer or humidifier in your bedroom or home to help loosen secretions.  Sleep in a semi-upright position if your cough is worse at night.  Rest as needed.  Stop smoking if you smoke. SEEK IMMEDIATE MEDICAL CARE IF:   You have pus in your sputum.  Your cough starts to worsen.  You cannot control your cough with suppressants and are losing sleep.  You begin coughing up blood.  You have difficulty breathing.  You develop pain which is getting worse or is uncontrolled with medicine.  You have a fever. MAKE SURE YOU:   Understand these instructions.  Will watch your condition.  Will get help right away if you are not doing well or get worse. Document Released: 09/08/2010 Document Revised: 06/04/2011 Document Reviewed: 09/08/2010 [facility] Patient Information 2015 [place], [place]. This information is not intended  to replace advice given to you by your health care provider. Make sure you discuss any questions you have with your health care provider. Generalized Anxiety Disorder Generalized anxiety disorder (GAD) is a mental disorder. It interferes with life functions, including relationships, work, and school. GAD is different from normal anxiety, which everyone experiences at some point in their lives in response to specific life events and activities. Normal anxiety actually helps us prepare for and get through these life events and activities. Normal anxiety goes away after the event or activity is over.  GAD causes anxiety that is not necessarily related to specific events or activities. It also causes excess anxiety in proportion to specific events or activities. The anxiety associated with GAD is also difficult to control. GAD can vary from mild to severe. People with severe GAD can have intense waves of anxiety with physical symptoms (panic attacks).  SYMPTOMS The anxiety and worry associated with GAD are difficult to control. This anxiety and worry are related to many life events and activities and also occur more days than not for 6 months or longer. People with GAD also have three or more of the following symptoms (one or more in children):  Restlessness.   Fatigue.  Difficulty concentrating.   Irritability.  Muscle tension.  Difficulty sleeping or unsatisfying sleep. DIAGNOSIS GAD is diagnosed through an assessment by your health care provider. Your health care provider will ask you questions aboutyour mood,physical symptoms, and events in your life. Your health care provider may ask you about your medical history and use of alcohol  or drugs, including prescription medicines. Your health care provider may also do a physical exam and blood tests. Certain medical conditions and the use of certain substances can cause symptoms similar to those associated with GAD. Your health care provider may  refer you to a mental health specialist for further evaluation. TREATMENT The following therapies are usually used to treat GAD:   Medication. Antidepressant medication usually is prescribed for long-term daily control. Antianxiety medicines may be added in severe cases, especially when panic attacks occur.   Talk therapy (psychotherapy). Certain types of talk therapy can be helpful in treating GAD by providing support, education, and guidance. A form of talk therapy called cognitive behavioral therapy can teach you healthy ways to think about and react to daily life events and activities.  Stress managementtechniques. These include yoga, meditation, and exercise and can be very helpful when they are practiced regularly. A mental health specialist can help determine which treatment is best for you. Some people see improvement with one therapy. However, other people require a combination of therapies. Document Released: 07/07/2012 Document Revised: 07/27/2013 Document Reviewed: 07/07/2012 [facility] Patient Information 2015 [place], [place]. This information is not intended to replace advice given to you by your health care provider. Make sure you discuss any questions you have with your health care provider. Epidermal Cyst An epidermal cyst is sometimes called a sebaceous cyst, epidermal inclusion cyst, or infundibular cyst. These cysts usually contain a substance that looks "pasty" or "cheesy" and may have a bad smell. This substance is a protein called keratin. Epidermal cysts are usually found on the face, neck, or trunk. They may also occur in the vaginal area or other parts of the genitalia of both men and women. Epidermal cysts are usually small, painless, slow-growing bumps or lumps that move freely under the skin. It is important not to try to pop them. This may cause an infection and lead to tenderness and swelling. CAUSES  Epidermal cysts may be caused by a deep penetrating injury to the skin  or a plugged hair follicle, often associated with acne. SYMPTOMS  Epidermal cysts can become inflamed and cause:  Redness.  Tenderness.  Increased temperature of the skin over the bumps or lumps.  Grayish-white, bad smelling material that drains from the bump or lump. DIAGNOSIS  Epidermal cysts are easily diagnosed by your caregiver during an exam. Rarely, a tissue sample (biopsy) may be taken to rule out other conditions that may resemble epidermal cysts. TREATMENT   Epidermal cysts often get better and disappear on their own. They are rarely ever cancerous.  If a cyst becomes infected, it may become inflamed and tender. This may require opening and draining the cyst. Treatment with antibiotics may be necessary. When the infection is gone, the cyst may be removed with minor surgery.  Small, inflamed cysts can often be treated with antibiotics or by injecting steroid medicines.  Sometimes, epidermal cysts become large and bothersome. If this happens, surgical removal in your caregiver's office may be necessary. HOME CARE INSTRUCTIONS  Only take over-the-counter or prescription medicines as directed by your caregiver.  Take your antibiotics as directed. Finish them even if you start to feel better. SEEK MEDICAL CARE IF:   Your cyst becomes tender, red, or swollen.  Your condition is not improving or is getting worse.  You have any other questions or concerns. MAKE SURE YOU:  Understand these instructions.  Will watch your condition.  Will get help right away if you are not doing  well or get worse. Document Released: 02/11/2004 Document Revised: 06/04/2011 Document Reviewed: 09/18/2010 [facility] Patient Information 2015 [place], [place]. This information is not intended to replace advice given to you by your health care provider. Make sure you discuss any questions you have with your health care provider.

## 2014-01-16 NOTE — ED Notes (Signed)
NP at bedside to aspirate area.

## 2014-01-16 NOTE — ED Provider Notes (Signed)
Medical screening examination/treatment/procedure(s) were performed by non-physician practitioner and as supervising physician I was immediately available for consultation/collaboration.   EKG Interpretation   Date/Time:  Friday January 15 2014 23:00:22 EDT Ventricular Rate:  75 PR Interval:  182 QRS Duration: 76 QT Interval:  380 QTC Calculation: 424 R Axis:   78 Text Interpretation:  Normal sinus rhythm Nonspecific ST abnormality  Abnormal ECG Similar to prior Confirmed by Gwendolyn GrantWALDEN  MD, Larry Knipp ([id number]) on  01/16/2014 12:18:35 AM        Elwin MochaBlair Katha Kuehne, MD 01/16/14 1501

## 2014-01-17 ENCOUNTER — Emergency Department (HOSPITAL_COMMUNITY)
Admission: EM | Admit: 2014-01-17 | Discharge: 2014-01-18 | Disposition: A | Discharge status: Home / Self Care | Payer: BC Managed Care – PPO | Attending: Emergency Medicine | Admitting: Emergency Medicine

## 2014-01-17 ENCOUNTER — Emergency Department (HOSPITAL_COMMUNITY): Payer: BC Managed Care – PPO

## 2014-01-17 ENCOUNTER — Encounter (HOSPITAL_COMMUNITY): Payer: Self-pay | Admitting: Emergency Medicine

## 2014-01-17 DIAGNOSIS — F410 Panic disorder [episodic paroxysmal anxiety] without agoraphobia: Secondary | ICD-10-CM

## 2014-01-17 DIAGNOSIS — F430 Acute stress reaction: Secondary | ICD-10-CM | POA: Insufficient documentation

## 2014-01-17 DIAGNOSIS — F419 Anxiety disorder, unspecified: Secondary | ICD-10-CM | POA: Insufficient documentation

## 2014-01-17 DIAGNOSIS — R079 Chest pain, unspecified: Secondary | ICD-10-CM

## 2014-01-17 DIAGNOSIS — I10 Essential (primary) hypertension: Secondary | ICD-10-CM | POA: Diagnosis not present

## 2014-01-17 DIAGNOSIS — Z79899 Other long term (current) drug therapy: Secondary | ICD-10-CM | POA: Diagnosis not present

## 2014-01-17 DIAGNOSIS — R05 Cough: Secondary | ICD-10-CM | POA: Diagnosis not present

## 2014-01-17 DIAGNOSIS — G43909 Migraine, unspecified, not intractable, without status migrainosus: Secondary | ICD-10-CM | POA: Insufficient documentation

## 2014-01-17 DIAGNOSIS — Z7951 Long term (current) use of inhaled steroids: Secondary | ICD-10-CM | POA: Diagnosis not present

## 2014-01-17 DIAGNOSIS — F439 Reaction to severe stress, unspecified: Secondary | ICD-10-CM

## 2014-01-17 LAB — BASIC METABOLIC PANEL
ANION GAP: 10 (ref 5–15)
BUN: 13 mg/dL (ref 6–23)
CO2: 26 mEq/L (ref 19–32)
Calcium: 8.7 mg/dL (ref 8.4–10.5)
Chloride: 103 mEq/L (ref 96–112)
Creatinine, Ser: 0.74 mg/dL (ref 0.50–1.10)
GFR calc Af Amer: >90 mL/min (ref >90)
GLUCOSE: 105 mg/dL — AB (ref 70–99)
POTASSIUM: 3.4 meq/L — AB (ref 3.7–5.3)
SODIUM: 139 meq/L (ref 137–147)

## 2014-01-17 LAB — CBC
HCT: 30 % — ABNORMAL LOW (ref 36.0–46.0)
HEMOGLOBIN: 9 g/dL — AB (ref 12.0–15.0)
MCH: 21 pg — ABNORMAL LOW (ref 26.0–34.0)
MCHC: 30 g/dL (ref 30.0–36.0)
MCV: 69.9 fL — ABNORMAL LOW (ref 78.0–100.0)
Platelets: 609 10*3/uL — ABNORMAL HIGH (ref 150–400)
RBC: 4.29 MIL/uL (ref 3.87–5.11)
RDW: 17.5 % — ABNORMAL HIGH (ref 11.5–15.5)
WBC: 6.5 10*3/uL (ref 4.0–10.5)

## 2014-01-17 LAB — I-STAT TROPONIN, ED
TROPONIN I, POC: 0 ng/mL (ref 0.00–0.08)
Troponin i, poc: 0 ng/mL (ref 0.00–0.08)

## 2014-01-17 MED ORDER — KETOROLAC TROMETHAMINE 30 MG/ML IJ SOLN: 30.0000 mg | Freq: Once | INTRAMUSCULAR | Status: DC

## 2014-01-17 MED ORDER — LORAZEPAM 2 MG/ML IJ SOLN: 0.5000 mg | Freq: Once | INTRAMUSCULAR | Status: DC

## 2014-01-17 MED ORDER — TRAMADOL HCL 50 MG PO TABS
50.0000 mg | ORAL_TABLET | Freq: Once | ORAL | Status: AC
  Administered 2014-01-17: 50 mg via ORAL
  Filled 2014-01-17: qty 1

## 2014-01-17 MED ORDER — LORAZEPAM 1 MG PO TABS
1.0000 mg | ORAL_TABLET | Freq: Once | ORAL | Status: AC
  Administered 2014-01-17: 1 mg via ORAL
  Filled 2014-01-17: qty 1

## 2014-01-17 NOTE — ED Notes (Signed)
Pt. reports mid chest tightness , palpitations and dry cough onset this evening , denies SOB , no nausea , mild diaphoresis , [place] . No fever or chills.

## 2014-01-17 NOTE — ED Notes (Signed)
Spoke to Marshall & Ilsleyiffany Greene, PA and made aware that pt refuses IV placement except for fluid boluses.  Requested orders be changed to PO.

## 2014-01-18 LAB — CULTURE, ROUTINE-ABSCESS

## 2014-01-18 MED ORDER — LORAZEPAM 1 MG PO TABS: 1.0000 mg | ORAL_TABLET | Freq: Three times a day (TID) | ORAL | Status: DC | PRN

## 2014-01-18 NOTE — ED Notes (Signed)
Discharge instructions and prescription reviewed with patient and family.  Both voiced understanding.

## 2014-01-18 NOTE — Discharge Instructions (Signed)

## 2014-01-18 NOTE — ED Notes (Signed)
Patient continues to lay in the bed talking on the phone.  Instructed we needed to get her discharged.

## 2014-01-18 NOTE — ED Provider Notes (Signed)
CSN: [id number]     Arrival date & time 01/17/14  2014 History   First MD Initiated Contact with Patient 01/17/14 2048     Chief Complaint  Patient presents with  . Chest Pain  . Palpitations  . Cough     (Consider location/radiation/quality/duration/timing/severity/associated sxs/prior Treatment) HPI  Patient to the ER with complaints of CP, dry cough, palpations onset this morning. She has been seen in the ED multiple times for the same and has been told that she has anxiety. She denies SOB, nausea, confusion or weakness. 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 [place] and has 3 kids. A 46 yo female, 46 yo son, and 734 yo son. She says that she wants everything to be perfect. All of her kids to eat there food, for her whole home and everything in it to be clean, for the kids to behave and act right. She says that her younger sons cause her so much stress because she is always very worried about them and upset that they do not listen or do not eat.   She says that her husbands worries most about work and doesn't know what she goes through everyday. She isn't able to work and is always at home. She typically likes to surf the Internet to decompress but her Internet does not always work well. The patient is concerned that her heart is "sick" because it flutters when she gets upset.  VSS  Past Medical History  Diagnosis Date  . Migraine   . Hypertension   . Anxiety    Past Surgical History  Procedure Laterality Date  . Cesarean section     No family history on file. History  Substance Use Topics  . Smoking status: Never Smoker   . Smokeless tobacco: Not on file  . Alcohol Use: No   OB History   Grav Para Term Preterm Abortions TAB SAB Ect Mult Living                 Review of Systems 10 Systems reviewed and are negative for acute change except as noted in the HPI.    Allergies  Pork-derived products  Home Medications   Prior to Admission  medications   Medication Sig Start Date End Date Taking? Authorizing Provider  ALPRAZolam Prudy Feeler(XANAX) 0.5 MG tablet Take 1 tablet (0.5 mg total) by mouth at bedtime as needed for anxiety. 01/15/14   Jimmye Normanavid John Smith, NP  cetirizine (ZYRTEC) 10 MG tablet Take 10 mg by mouth daily as needed for allergies.     Historical Provider, MD  FLUoxetine (PROZAC) 20 MG tablet Take 20 mg by mouth daily.    Historical Provider, MD  fluticasone (FLONASE) 50 MCG/ACT nasal spray Place 1 spray into both nostrils daily.    Historical Provider, MD  LORazepam (ATIVAN) 1 MG tablet Take 1 tablet (1 mg total) by mouth 3 (three) times daily as needed for anxiety. 01/18/14   Sheamus Hasting Irine SealG Sire Poet, PA-C  Melatonin 3 MG TABS Take 1 tablet by mouth at bedtime as needed (for sleep).    Historical Provider, MD  nitroGLYCERIN (NITROSTAT) 0.4 MG SL tablet Place 0.4 mg under the tongue every 5 (five) minutes as needed for chest pain.    Historical Provider, MD  senna-docusate (SENOKOT-S) 8.6-50 MG per tablet Take 1 tablet by mouth at bedtime as needed for mild constipation.    Historical Provider, MD  zolpidem (AMBIEN) 10 MG tablet Take 10 mg  by mouth at bedtime as needed for sleep.  02/17/13   Historical Provider, MD   BP 136/64  Pulse 88  Temp(Src) 98.2 F (36.8 C) (Oral)  Resp 18  SpO2 100%  LMP 01/14/2014 Physical Exam  Nursing note and vitals reviewed. Constitutional: She appears well-developed and well-nourished. No distress.  HENT:  Head: Normocephalic and atraumatic. Head is without raccoon's eyes, without Battle's sign, without abrasion, without contusion, without laceration, without right periorbital erythema and without left periorbital erythema.  Right Ear: No hemotympanum.  Nose: Nose normal.  Eyes: Conjunctivae and EOM are normal. Pupils are equal, round, and reactive to light.  Neck: Normal range of motion. Neck supple. No spinous process tenderness and no muscular tenderness present.  Cardiovascular: Normal rate  and regular rhythm.   Pulmonary/Chest: Effort normal. She has no decreased breath sounds. She has no wheezes. She has no rhonchi. She exhibits no tenderness and no laceration.  Abdominal: Soft. Bowel sounds are normal. There is no tenderness. There is no guarding.  Neurological: She is alert.  Skin: Skin is warm and dry.  Psychiatric: Her behavior is normal. Her mood appears anxious. Her speech is rapid and/or pressured. She does not exhibit a depressed mood. She expresses no homicidal and no suicidal ideation.    ED Course  Procedures (including critical care time) Labs Review Labs Reviewed  CBC - Abnormal; Notable for the following:    Hemoglobin 9.0 (*)    HCT 30.0 (*)    MCV 69.9 (*)    MCH 21.0 (*)    RDW 17.5 (*)    Platelets 609 (*)    All other components within normal limits  BASIC METABOLIC PANEL - Abnormal; Notable for the following:    Potassium 3.4 (*)    Glucose, Bld 105 (*)    All other components within normal limits  I-STAT TROPOININ, ED  Rosezena SensorI-STAT TROPOININ, ED    Imaging Review Dg Chest 2 View  01/17/2014   CLINICAL DATA:  Shortness of breath, tachycardia  EXAM: CHEST  2 VIEW  COMPARISON:  01/15/2014  FINDINGS: The heart size and mediastinal contours are within normal limits. Both lungs are clear. The visualized skeletal structures are unremarkable.  IMPRESSION: No active cardiopulmonary disease.   Electronically Signed   By: Jearld LeschAndrew  DelGaizo M.D.   On: 01/17/2014 21:02     EKG Interpretation None     Sandi RavelingLAMIN, Lorelie [id number] 17-Jan-2014 20:27:13 [facility] ROUTINE RECORD Sinus rhythm with occasional Premature ventricular complexes Nonspecific T wave abnormality Abnormal ECG 525mm/s 1910mm/mV 100Hz  8.0 SP2 12SL 241 HD CID: [id number] Referred by: Unconfirmed Vent. rate 86 BPM PR interval 178 ms QRS duration 78 ms QT/QTc 364/435 ms P-R-T axes 88 81  MDM   Final diagnoses:  Stress  Anxiety attack   The patient is dealing with significant  stressors. She does not want to talk to our psychiatrist or a psychiatrist at all. She says she is private and doesn't like to talk to people. I encouraged her to think about things she can do at home to try and decrease her stress and relax. She was given Ativan in the ED and said this helped significantly and that she would like a prescription for this. She was given Ambien in the past and did not like that medication. She admits that she realizes her symptoms are from anxiety but tells me she worries so much because she wants things to be perfect.   She has had a first and delta  Trop that are negative. No significant EKG findings aside from occasional PVC. Normal chest xray.  I feel that the patient can go home at this time and I have encouraged her to speak with her PCP or follow-up with the psychiatric referrals given to her from the ED.  48 y.o.Malvina A Ledlow's evaluation in the Emergency Department is complete. It has been determined that no acute conditions requiring further emergency intervention are present at this time. The patient/guardian have been advised of the diagnosis and plan. We have discussed signs and symptoms that warrant return to the ED, such as changes or worsening in symptoms.  Vital signs are stable at discharge. Filed Vitals:   01/17/14 2354  BP: 136/64  Pulse: 88  Temp:   Resp: 18    Patient/guardian has voiced understanding and agreed to follow-up with the PCP or specialist.      Dorthula Matas, PA-C 01/18/14 0025

## 2014-01-19 ENCOUNTER — Telehealth (HOSPITAL_COMMUNITY): Payer: Self-pay

## 2014-01-19 NOTE — ED Notes (Signed)
Post ED Visit - Positive Culture Follow-up  Culture report reviewed by antimicrobial stewardship pharmacist: []  Wes Dulaney, Pharm.D., BCPS [x]  Celedonio MiyamotoJeremy Frens, Pharm.D., BCPS []  Georgina PillionElizabeth Martin, Pharm.D., BCPS []  Van VoorhisMinh Pham, [address]Ph[address]D., BCPS, AAHIVP []  Estella HuskMichelle Turner, Pharm.D., BCPS, AAHIVP []  Carly Sabat, Pharm.D. []  Enzo BiNathan Batchelder, [address]Pharm[address]D.  Positive abcess culture No treatment needed, no further patient follow-up is required at this time.  Ashley JacobsFesterman, Nyriah Coote C 01/19/2014, 9:59 AM

## 2014-01-19 NOTE — ED Provider Notes (Signed)
Medical screening examination/treatment/procedure(s) were conducted as a shared visit with non-physician practitioner(s) and myself.  I personally evaluated the patient during the encounter.   EKG Interpretation None       Briefly, pt is a 48 y.o. female presenting with anxiety symptoms.  I performed an examination on the patient including cardiac, pulmonary, and gi systems which were unremarkable.  No concerning features for si/hi/hallucinations.  DC home to fu with pcp.     Mirian MoMatthew Gentry, MD 01/19/14 [phone]

## 2014-01-23 ENCOUNTER — Encounter (HOSPITAL_COMMUNITY): Payer: Self-pay | Admitting: Emergency Medicine

## 2014-01-23 ENCOUNTER — Emergency Department (HOSPITAL_COMMUNITY)
Admission: EM | Admit: 2014-01-23 | Discharge: 2014-01-23 | Disposition: A | Discharge status: Home / Self Care | Payer: BC Managed Care – PPO | Attending: Emergency Medicine | Admitting: Emergency Medicine

## 2014-01-23 DIAGNOSIS — I10 Essential (primary) hypertension: Secondary | ICD-10-CM | POA: Insufficient documentation

## 2014-01-23 DIAGNOSIS — Z7951 Long term (current) use of inhaled steroids: Secondary | ICD-10-CM | POA: Insufficient documentation

## 2014-01-23 DIAGNOSIS — L02212 Cutaneous abscess of back [any part, except buttock]: Secondary | ICD-10-CM | POA: Insufficient documentation

## 2014-01-23 DIAGNOSIS — F419 Anxiety disorder, unspecified: Secondary | ICD-10-CM | POA: Diagnosis not present

## 2014-01-23 DIAGNOSIS — Z79899 Other long term (current) drug therapy: Secondary | ICD-10-CM | POA: Diagnosis not present

## 2014-01-23 MED ORDER — OXYCODONE-ACETAMINOPHEN 5-325 MG PO TABS
2.0000 | ORAL_TABLET | Freq: Once | ORAL | Status: AC
  Administered 2014-01-23: 2 via ORAL
  Filled 2014-01-23: qty 2

## 2014-01-23 MED ORDER — LIDOCAINE-EPINEPHRINE-TETRACAINE (LET) SOLUTION
3.0000 mL | Freq: Once | NASAL | Status: AC
  Administered 2014-01-23: 3 mL via TOPICAL
  Filled 2014-01-23: qty 3

## 2014-01-23 MED ORDER — LIDOCAINE HCL (PF) 1 % IJ SOLN
5.0000 mL | Freq: Once | INTRAMUSCULAR | Status: AC
  Administered 2014-01-23: 5 mL via INTRADERMAL
  Filled 2014-01-23: qty 5

## 2014-01-23 NOTE — ED Notes (Addendum)
X 4-5 days ago pt. Had an abscessed drained from lt. Side of back. Since then, it has gotten bigger and uncomfortable. It is warm to touch, no drainage.  States, "yellow drainage this am."

## 2014-01-23 NOTE — ED Provider Notes (Signed)
CSN: [id number]     Arrival date & time 01/23/14  1629 History   This chart was scribed for non-physician practitioner, Dierdre ForthHannah Tena Linebaugh, PA-C, working with Layla MawKristen N Ward, DO by Freida Busmaniana Omoyeni, ED Scribe. This patient was seen in room TR11C/TR11C and the patient's care was started at 4:53 PM.    Chief Complaint  Patient presents with  . Abscess    The history is provided by the patient, medical records and a relative. No language interpreter was used.    HPI Comments:  Samantha Logan is a 48 y.o. female who presents to the Emergency Department complaining of painful bump to her left upper back for about 4-5 days. Per patient's daughter, pt came to ED about  one week ago with the same bump that was much smaller to the same area on her back.  Daughter states they took a sample of the site and since doing so the area has gotten larger and more painful. She reports some drainage from the site this am. She reports blood and yellow drainage. She denies fever, nausea and vomiting. No alleviating factors noted.  Records review shows that patient was seen on 10/24 for anxiety and a disc though expressed concern about a lump on her back. Aspiration was performed at that time And it was determined that patient had an epidermal cyst.   Past Medical History  Diagnosis Date  . Migraine   . Hypertension   . Anxiety    Past Surgical History  Procedure Laterality Date  . Cesarean section     History reviewed. No pertinent family history. History  Substance Use Topics  . Smoking status: Never Smoker   . Smokeless tobacco: Not on file  . Alcohol Use: No   OB History   Grav Para Term Preterm Abortions TAB SAB Ect Mult Living                 Review of Systems  Constitutional: Negative for fever and chills.  Gastrointestinal: Negative for nausea and vomiting.  Skin: Positive for rash.  Allergic/Immunologic: Negative for immunocompromised state.  Hematological: Does not bruise/bleed easily.   Psychiatric/Behavioral: The patient is not nervous/anxious.       Allergies  Pork-derived products  Home Medications   Prior to Admission medications   Medication Sig Start Date End Date Taking? Authorizing Provider  ALPRAZolam Prudy Feeler(XANAX) 0.5 MG tablet Take 1 tablet (0.5 mg total) by mouth at bedtime as needed for anxiety. 01/15/14   Jimmye Normanavid John Smith, NP  cetirizine (ZYRTEC) 10 MG tablet Take 10 mg by mouth daily as needed for allergies.     Historical Provider, MD  FLUoxetine (PROZAC) 20 MG tablet Take 20 mg by mouth daily.    Historical Provider, MD  fluticasone (FLONASE) 50 MCG/ACT nasal spray Place 1 spray into both nostrils daily.    Historical Provider, MD  LORazepam (ATIVAN) 1 MG tablet Take 1 tablet (1 mg total) by mouth 3 (three) times daily as needed for anxiety. 01/18/14   Tiffany Irine SealG Greene, PA-C  Melatonin 3 MG TABS Take 1 tablet by mouth at bedtime as needed (for sleep).    Historical Provider, MD  nitroGLYCERIN (NITROSTAT) 0.4 MG SL tablet Place 0.4 mg under the tongue every 5 (five) minutes as needed for chest pain.    Historical Provider, MD  senna-docusate (SENOKOT-S) 8.6-50 MG per tablet Take 1 tablet by mouth at bedtime as needed for mild constipation.    Historical Provider, MD  zolpidem (AMBIEN) 10 MG  tablet Take 10 mg by mouth at bedtime as needed for sleep.  02/17/13   Historical Provider, MD   BP 117/64  Pulse 78  Temp(Src) 98.3 F (36.8 C) (Oral)  Resp 16  SpO2 100%  LMP 01/14/2014 Physical Exam  Nursing note and vitals reviewed. Constitutional: She is oriented to person, place, and time. She appears well-developed and well-nourished. No distress.  HENT:  Head: Normocephalic and atraumatic.  Eyes: Conjunctivae are normal. No scleral icterus.  Neck: Normal range of motion.  Cardiovascular: Normal rate, regular rhythm and intact distal pulses.   Pulmonary/Chest: Effort normal and breath sounds normal.  Abdominal: Soft. She exhibits no distension. There is no  tenderness.  Lymphadenopathy:    She has no cervical adenopathy.  Neurological: She is alert and oriented to person, place, and time.  Skin: Skin is warm and dry. She is not diaphoretic. There is erythema.  6x4 cm area of erythema with mild induration and central fluctuance. Draining purulent drainage.   Psychiatric: She has a normal mood and affect.    ED Course  Procedures   DIAGNOSTIC STUDIES:  Oxygen Saturation is 100% on RA, normal by my interpretation.    COORDINATION OF CARE:  5:00 PM Will I&D site. Discussed treatment plan with pt at bedside and pt agreed to plan. 6:29 PM INCISION AND DRAINAGE PROCEDURE NOTE: Patient identification was confirmed and verbal consent was obtained. This procedure was performed by Layla Maw Ward, DO at 6:30 PM. Site: left posterior chest wall Sterile procedures observed Needle size: 25 gauge for anesthesia Anesthetic used (type and amt): 4 CCs of 1% lido without epi Blade size: 11 Drainage: moderate, solid, and minimal liquid  Complexity: Complex Packing used--NONE  Site anesthetized, incision made over site, wound drained and explored loculations, rinsed with copious amounts of normal saline, wound packed with sterile gauze, covered with dry, sterile dressing.  Pt tolerated procedure well without complications.  Instructions for care discussed verbally and pt provided with additional written instructions for homecare and f/u.    Labs Review Labs Reviewed - No data to display  Imaging Review No results found.   EKG Interpretation None      MDM   Final diagnoses:  Abscess of back   Samantha Logan presents with abscess, or any draining and without signs of systemic symptoms.  Patient with skin abscess amenable to incision and drainage.  Abscess was not large enough to warrant packing or drain,  wound recheck in 2 days. Encouraged home warm soaks and flushing.  Mild signs of cellulitis is surrounding skin.  Will d/c to home.  No  antibiotic therapy is indicated.  I have personally reviewed patient's vitals, nursing note and any pertinent labs or imaging.  I performed an focused physical exam; undressed when appropriate .    It has been determined that no acute conditions requiring further emergency intervention are present at this time. The patient/guardian have been advised of the diagnosis and plan. I reviewed any labs and imaging including any potential incidental findings. We have discussed signs and symptoms that warrant return to the ED and they are listed in the discharge instructions.    Vital signs are stable at discharge.   BP 117/64  Pulse 78  Temp(Src) 98.3 F (36.8 C) (Oral)  Resp 16  SpO2 100%  LMP 01/14/2014   I personally performed the services described in this documentation, which was scribed in my presence. The recorded information has been reviewed and is accurate.  Dierdre ForthHannah Deion Swift, PA-C 01/23/14 1849

## 2014-01-23 NOTE — ED Notes (Signed)
Declined W/C at D/C and was escorted to lobby by RN. 

## 2014-01-23 NOTE — Discharge Instructions (Signed)
1. Medications: ibuprofen for pain, usual home medications 2. Treatment: rest, drink plenty of fluids, keep wound clean with warm soap and water 3. Follow Up: Please followup with your primary doctor in 3 days for discussion of your diagnoses and further evaluation after today's visit; if you do not have a primary care doctor use the resource guide provided to find one; Please return to the ER for fevers, chills, worsening symptoms   Abscess Care After An abscess (also called a boil or furuncle) is an infected area that contains a collection of pus. Signs and symptoms of an abscess include pain, tenderness, redness, or hardness, or you may feel a moveable soft area under your skin. An abscess can occur anywhere in the body. The infection may spread to surrounding tissues causing cellulitis. A cut (incision) by the surgeon was made over your abscess and the pus was drained out. Gauze may have been packed into the space to provide a drain that will allow the cavity to heal from the inside outwards. The boil may be painful for 5 to 7 days. Most people with a boil do not have high fevers. Your abscess, if seen early, may not have localized, and may not have been lanced. If not, another appointment may be required for this if it does not get better on its own or with medications. HOME CARE INSTRUCTIONS   Only take over-the-counter or prescription medicines for pain, discomfort, or fever as directed by your caregiver.  When you bathe, soak and then remove gauze or iodoform packs at least daily or as directed by your caregiver. You may then wash the wound gently with mild soapy water. Repack with gauze or do as your caregiver directs. SEEK IMMEDIATE MEDICAL CARE IF:   You develop increased pain, swelling, redness, drainage, or bleeding in the wound site.  You develop signs of generalized infection including muscle aches, chills, fever, or a general ill feeling.  An oral temperature above 102 F (38.9 C)  develops, not controlled by medication. See your caregiver for a recheck if you develop any of the symptoms described above. If medications (antibiotics) were prescribed, take them as directed. Document Released: 09/28/2004 Document Revised: 06/04/2011 Document Reviewed: 05/26/2007 [facility] Patient Information 2015 [place]. This information is not intended to replace advice given to you by your health care provider. Make sure you discuss any questions you have with your health care provider.

## 2014-01-23 NOTE — ED Provider Notes (Signed)
Medical screening examination/treatment/procedure(s) were performed by non-physician practitioner and as supervising physician I was immediately available for consultation/collaboration.   EKG Interpretation None        Jurrell Royster N Nylah Butkus, DO 01/23/14 2241 

## 2014-06-01 ENCOUNTER — Encounter (HOSPITAL_COMMUNITY): Payer: Self-pay | Admitting: Emergency Medicine

## 2014-06-01 DIAGNOSIS — F419 Anxiety disorder, unspecified: Secondary | ICD-10-CM | POA: Insufficient documentation

## 2014-06-01 DIAGNOSIS — J069 Acute upper respiratory infection, unspecified: Secondary | ICD-10-CM | POA: Diagnosis not present

## 2014-06-01 DIAGNOSIS — J40 Bronchitis, not specified as acute or chronic: Secondary | ICD-10-CM | POA: Diagnosis not present

## 2014-06-01 DIAGNOSIS — I10 Essential (primary) hypertension: Secondary | ICD-10-CM | POA: Diagnosis not present

## 2014-06-01 DIAGNOSIS — Z7951 Long term (current) use of inhaled steroids: Secondary | ICD-10-CM | POA: Diagnosis not present

## 2014-06-01 DIAGNOSIS — R509 Fever, unspecified: Secondary | ICD-10-CM | POA: Diagnosis present

## 2014-06-01 DIAGNOSIS — Z79899 Other long term (current) drug therapy: Secondary | ICD-10-CM | POA: Diagnosis not present

## 2014-06-01 MED ORDER — ACETAMINOPHEN 325 MG PO TABS
ORAL_TABLET | ORAL | Status: AC
  Filled 2014-06-01: qty 2

## 2014-06-01 MED ORDER — ACETAMINOPHEN 325 MG PO TABS
650.0000 mg | ORAL_TABLET | Freq: Once | ORAL | Status: AC
  Administered 2014-06-01: 650 mg via ORAL

## 2014-06-01 NOTE — ED Notes (Addendum)
Pt presents with generalized body aches for the past 2 weeks, admits that she began having fevers today.  Denies medication for fever at home.  Pt also admits to head pressure.

## 2014-06-02 ENCOUNTER — Emergency Department (HOSPITAL_COMMUNITY)
Admission: EM | Admit: 2014-06-02 | Discharge: 2014-06-02 | Disposition: A | Discharge status: Home / Self Care | Payer: BLUE CROSS/BLUE SHIELD | Attending: Emergency Medicine | Admitting: Emergency Medicine

## 2014-06-02 ENCOUNTER — Emergency Department (HOSPITAL_COMMUNITY): Payer: BLUE CROSS/BLUE SHIELD

## 2014-06-02 DIAGNOSIS — J069 Acute upper respiratory infection, unspecified: Secondary | ICD-10-CM

## 2014-06-02 DIAGNOSIS — J40 Bronchitis, not specified as acute or chronic: Secondary | ICD-10-CM

## 2014-06-02 LAB — CBC
HCT: 29.4 % — ABNORMAL LOW (ref 36.0–46.0)
Hemoglobin: 8.7 g/dL — ABNORMAL LOW (ref 12.0–15.0)
MCH: 20.4 pg — ABNORMAL LOW (ref 26.0–34.0)
MCHC: 29.6 g/dL — ABNORMAL LOW (ref 30.0–36.0)
MCV: 68.9 fL — AB (ref 78.0–100.0)
Platelets: 595 10*3/uL — ABNORMAL HIGH (ref 150–400)
RBC: 4.27 MIL/uL (ref 3.87–5.11)
RDW: 17.3 % — ABNORMAL HIGH (ref 11.5–15.5)
WBC: 5 10*3/uL (ref 4.0–10.5)

## 2014-06-02 LAB — BASIC METABOLIC PANEL
ANION GAP: 5 (ref 5–15)
BUN: 9 mg/dL (ref 6–23)
CHLORIDE: 105 mmol/L (ref 96–112)
CO2: 26 mmol/L (ref 19–32)
Calcium: 8.7 mg/dL (ref 8.4–10.5)
Creatinine, Ser: 0.75 mg/dL (ref 0.50–1.10)
GFR calc Af Amer: >90 mL/min (ref >90)
Glucose, Bld: 110 mg/dL — ABNORMAL HIGH (ref 70–99)
POTASSIUM: 3.6 mmol/L (ref 3.5–5.1)
SODIUM: 136 mmol/L (ref 135–145)

## 2014-06-02 MED ORDER — ALBUTEROL SULFATE HFA 108 (90 BASE) MCG/ACT IN AERS
1.0000 | INHALATION_SPRAY | RESPIRATORY_TRACT | Status: DC | PRN
  Administered 2014-06-02: 2 via RESPIRATORY_TRACT
  Filled 2014-06-02: qty 6.7

## 2014-06-02 MED ORDER — GUAIFENESIN-DM 100-10 MG/5ML PO SYRP
5.0000 mL | ORAL_SOLUTION | ORAL | Status: DC | PRN
  Administered 2014-06-02: 5 mL via ORAL
  Filled 2014-06-02: qty 5

## 2014-06-02 MED ORDER — BENZONATATE 100 MG PO CAPS
200.0000 mg | ORAL_CAPSULE | Freq: Once | ORAL | Status: AC
  Administered 2014-06-02: 200 mg via ORAL
  Filled 2014-06-02: qty 2

## 2014-06-02 MED ORDER — BENZONATATE 200 MG PO CAPS: 200.0000 mg | ORAL_CAPSULE | Freq: Once | ORAL | Status: DC

## 2014-06-02 MED ORDER — IBUPROFEN 800 MG PO TABS
800.0000 mg | ORAL_TABLET | Freq: Once | ORAL | Status: AC
  Administered 2014-06-02: 800 mg via ORAL
  Filled 2014-06-02: qty 1

## 2014-06-02 MED ORDER — GUAIFENESIN-DM 100-10 MG/5ML PO SYRP: 5.0000 mL | ORAL_SOLUTION | ORAL | Status: DC | PRN

## 2014-06-02 MED ORDER — IBUPROFEN 800 MG PO TABS: 800.0000 mg | ORAL_TABLET | Freq: Three times a day (TID) | ORAL | Status: DC | PRN

## 2014-06-02 MED ORDER — ALBUTEROL SULFATE HFA 108 (90 BASE) MCG/ACT IN AERS: 1.0000 | INHALATION_SPRAY | RESPIRATORY_TRACT | Status: DC | PRN

## 2014-06-02 NOTE — Discharge Instructions (Signed)
Take medications as prescribed.  Expect to have cough for up to a month.  Hot tea with lemon and honey may help to ease cough.  Follow-up with your doctor in 3-5 days for recheck.  Alternate ibuprofen and Tylenol for fever, headache or body aches.   How to Use an Inhaler Proper inhaler technique is very important. Good technique ensures that the medicine reaches the lungs. Poor technique results in depositing the medicine on the tongue and back of the throat rather than in the airways. If you do not use the inhaler with good technique, the medicine will not help you. STEPS TO FOLLOW IF USING AN INHALER WITHOUT AN EXTENSION TUBE  Remove the cap from the inhaler.  If you are using the inhaler for the first time, you will need to prime it. Shake the inhaler for 5 seconds and release four puffs into the air, away from your face. Ask your health care provider or pharmacist if you have questions about priming your inhaler.  Shake the inhaler for 5 seconds before each breath in (inhalation).  Position the inhaler so that the top of the canister faces up.  Put your index finger on the top of the medicine canister. Your thumb supports the bottom of the inhaler.  Open your mouth.  Either place the inhaler between your teeth and place your lips tightly around the mouthpiece, or hold the inhaler 1-2 inches away from your open mouth. If you are unsure of which technique to use, ask your health care provider.  Breathe out (exhale) normally and as completely as possible.  Press the canister down with your index finger to release the medicine.  At the same time as the canister is pressed, inhale deeply and slowly until your lungs are completely filled. This should take 4-6 seconds. Keep your tongue down.  Hold the medicine in your lungs for 5-10 seconds (10 seconds is best). This helps the medicine get into the small airways of your lungs.  Breathe out slowly, through pursed lips. Whistling is an  example of pursed lips.  Wait at least 15-30 seconds between puffs. Continue with the above steps until you have taken the number of puffs your health care provider has ordered. Do not use the inhaler more than your health care provider tells you.  Replace the cap on the inhaler.  Follow the directions from your health care provider or the inhaler insert for cleaning the inhaler. STEPS TO FOLLOW IF USING AN INHALER WITH AN EXTENSION (SPACER)  Remove the cap from the inhaler.  If you are using the inhaler for the first time, you will need to prime it. Shake the inhaler for 5 seconds and release four puffs into the air, away from your face. Ask your health care provider or pharmacist if you have questions about priming your inhaler.  Shake the inhaler for 5 seconds before each breath in (inhalation).  Place the open end of the spacer onto the mouthpiece of the inhaler.  Position the inhaler so that the top of the canister faces up and the spacer mouthpiece faces you.  Put your index finger on the top of the medicine canister. Your thumb supports the bottom of the inhaler and the spacer.  Breathe out (exhale) normally and as completely as possible.  Immediately after exhaling, place the spacer between your teeth and into your mouth. Close your lips tightly around the spacer.  Press the canister down with your index finger to release the medicine.  At  the same time as the canister is pressed, inhale deeply and slowly until your lungs are completely filled. This should take 4-6 seconds. Keep your tongue down and out of the way.  Hold the medicine in your lungs for 5-10 seconds (10 seconds is best). This helps the medicine get into the small airways of your lungs. Exhale.  Repeat inhaling deeply through the spacer mouthpiece. Again hold that breath for up to 10 seconds (10 seconds is best). Exhale slowly. If it is difficult to take this second deep breath through the spacer, breathe normally  several times through the spacer. Remove the spacer from your mouth.  Wait at least 15-30 seconds between puffs. Continue with the above steps until you have taken the number of puffs your health care provider has ordered. Do not use the inhaler more than your health care provider tells you.  Remove the spacer from the inhaler, and place the cap on the inhaler.  Follow the directions from your health care provider or the inhaler insert for cleaning the inhaler and spacer. If you are using different kinds of inhalers, use your quick relief medicine to open the airways 10-15 minutes before using a steroid if instructed to do so by your health care provider. If you are unsure which inhalers to use and the order of using them, ask your health care provider, nurse, or respiratory therapist. If you are using a steroid inhaler, always rinse your mouth with water after your last puff, then gargle and spit out the water. Do not swallow the water. AVOID:  Inhaling before or after starting the spray of medicine. It takes practice to coordinate your breathing with triggering the spray.  Inhaling through the nose (rather than the mouth) when triggering the spray. HOW TO DETERMINE IF YOUR INHALER IS FULL OR NEARLY EMPTY You cannot know when an inhaler is empty by shaking it. A few inhalers are now being made with dose counters. Ask your health care provider for a prescription that has a dose counter if you feel you need that extra help. If your inhaler does not have a counter, ask your health care provider to help you determine the date you need to refill your inhaler. Write the refill date on a calendar or your inhaler canister. Refill your inhaler 7-10 days before it runs out. Be sure to keep an adequate supply of medicine. This includes making sure it is not expired, and that you have a spare inhaler.  SEEK MEDICAL CARE IF:   Your symptoms are only partially relieved with your inhaler.  You are having  trouble using your inhaler.  You have some increase in phlegm. SEEK IMMEDIATE MEDICAL CARE IF:   You feel little or no relief with your inhalers. You are still wheezing and are feeling shortness of breath or tightness in your chest or both.  You have dizziness, headaches, or a fast heart rate.  You have chills, fever, or night sweats.  You have a noticeable increase in phlegm production, or there is blood in the phlegm. MAKE SURE YOU:   Understand these instructions.  Will watch your condition.  Will get help right away if you are not doing well or get worse. Document Released: 03/09/2000 Document Revised: 12/31/2012 Document Reviewed: 10/09/2012 [facility] Patient Information 2015 [place], [place]. This information is not intended to replace advice given to you by your health care provider. Make sure you discuss any questions you have with your health care provider.  Recruitment consultant  may help relieve the symptoms of a cough and cold. They add moisture to the air, which helps mucus to become thinner and less sticky. This makes it easier to breathe and cough up secretions. Cool mist vaporizers do not cause serious burns like hot mist vaporizers, which may also be called steamers or humidifiers. Vaporizers have not been proven to help with colds. You should not use a vaporizer if you are allergic to mold. HOME CARE INSTRUCTIONS  Follow the package instructions for the vaporizer.  Do not use anything other than distilled water in the vaporizer.  Do not run the vaporizer all of the time. This can cause mold or bacteria to grow in the vaporizer.  Clean the vaporizer after each time it is used.  Clean and dry the vaporizer well before storing it.  Stop using the vaporizer if worsening respiratory symptoms develop. Document Released: 12/08/2003 Document Revised: 03/17/2013 Document Reviewed: 07/30/2012 [facility] Patient Information 2015 [place], [place]. This information is  not intended to replace advice given to you by your health care provider. Make sure you discuss any questions you have with your health care provider.  Upper Respiratory Infection, Adult An upper respiratory infection (URI) is also sometimes known as the common cold. The upper respiratory tract includes the nose, sinuses, throat, trachea, and bronchi. Bronchi are the airways leading to the lungs. Most people improve within 1 week, but symptoms can last up to 2 weeks. A residual cough may last even longer.  CAUSES Many different viruses can infect the tissues lining the upper respiratory tract. The tissues become irritated and inflamed and often become very moist. Mucus production is also common. A cold is contagious. You can easily spread the virus to others by oral contact. This includes kissing, sharing a glass, coughing, or sneezing. Touching your mouth or nose and then touching a surface, which is then touched by another person, can also spread the virus. SYMPTOMS  Symptoms typically develop 1 to 3 days after you come in contact with a cold virus. Symptoms vary from person to person. They may include:  Runny nose.  Sneezing.  Nasal congestion.  Sinus irritation.  Sore throat.  Loss of voice (laryngitis).  Cough.  Fatigue.  Muscle aches.  Loss of appetite.  Headache.  Low-grade fever. DIAGNOSIS  You might diagnose your own cold based on familiar symptoms, since most people get a cold 2 to 3 times a year. Your caregiver can confirm this based on your exam. Most importantly, your caregiver can check that your symptoms are not due to another disease such as strep throat, sinusitis, pneumonia, asthma, or epiglottitis. Blood tests, throat tests, and X-rays are not necessary to diagnose a common cold, but they may sometimes be helpful in excluding other more serious diseases. Your caregiver will decide if any further tests are required. RISKS AND COMPLICATIONS  You may be at risk for  a more severe case of the common cold if you smoke cigarettes, have chronic heart disease (such as heart failure) or lung disease (such as asthma), or if you have a weakened immune system. The very young and very old are also at risk for more serious infections. Bacterial sinusitis, middle ear infections, and bacterial pneumonia can complicate the common cold. The common cold can worsen asthma and chronic obstructive pulmonary disease (COPD). Sometimes, these complications can require emergency medical care and may be life-threatening. PREVENTION  The best way to protect against getting a cold is to practice good hygiene. Avoid oral or  hand contact with people with cold symptoms. Wash your hands often if contact occurs. There is no clear evidence that vitamin C, vitamin E, echinacea, or exercise reduces the chance of developing a cold. However, it is always recommended to get plenty of rest and practice good nutrition. TREATMENT  Treatment is directed at relieving symptoms. There is no cure. Antibiotics are not effective, because the infection is caused by a virus, not by bacteria. Treatment may include:  Increased fluid intake. Sports drinks offer valuable electrolytes, sugars, and fluids.  Breathing heated mist or steam (vaporizer or shower).  Eating chicken soup or other clear broths, and maintaining good nutrition.  Getting plenty of rest.  Using gargles or lozenges for comfort.  Controlling fevers with ibuprofen or acetaminophen as directed by your caregiver.  Increasing usage of your inhaler if you have asthma. Zinc gel and zinc lozenges, taken in the first 24 hours of the common cold, can shorten the duration and lessen the severity of symptoms. Pain medicines may help with fever, muscle aches, and throat pain. A variety of non-prescription medicines are available to treat congestion and runny nose. Your caregiver can make recommendations and may suggest nasal or lung inhalers for other  symptoms.  HOME CARE INSTRUCTIONS   Only take over-the-counter or prescription medicines for pain, discomfort, or fever as directed by your caregiver.  Use a warm mist humidifier or inhale steam from a shower to increase air moisture. This may keep secretions moist and make it easier to breathe.  Drink enough water and fluids to keep your urine clear or pale yellow.  Rest as needed.  Return to work when your temperature has returned to normal or as your caregiver advises. You may need to stay home longer to avoid infecting others. You can also use a face mask and careful hand washing to prevent spread of the virus. SEEK MEDICAL CARE IF:   After the first few days, you feel you are getting worse rather than better.  You need your caregiver's advice about medicines to control symptoms.  You develop chills, worsening shortness of breath, or brown or red sputum. These may be signs of pneumonia.  You develop yellow or brown nasal discharge or pain in the face, especially when you bend forward. These may be signs of sinusitis.  You develop a fever, swollen neck glands, pain with swallowing, or white areas in the back of your throat. These may be signs of strep throat. SEEK IMMEDIATE MEDICAL CARE IF:   You have a fever.  You develop severe or persistent headache, ear pain, sinus pain, or chest pain.  You develop wheezing, a prolonged cough, cough up blood, or have a change in your usual mucus (if you have chronic lung disease).  You develop sore muscles or a stiff neck. Document Released: 09/05/2000 Document Revised: 06/04/2011 Document Reviewed: 06/17/2013 [facility] Patient Information 2015 [place], [place]. This information is not intended to replace advice given to you by your health care provider. Make sure you discuss any questions you have with your health care provider.

## 2014-06-02 NOTE — ED Provider Notes (Signed)
CSN: [id number]     Arrival date & time 06/01/14  2317 History  This chart was scribed for Marisa Severinlga Richard Ritchey, MD by Bronson CurbJacqueline Melvin, ED Scribe. This patient was seen in room B19C/B19C and the patient's care was started at 1:35 AM.   Chief Complaint  Patient presents with  . Fever  . Generalized Body Aches    The history is provided by the patient. No language interpreter was used.     HPI Comments: Samantha Logan is a 49 y.o. female, with history of migraine, HTN, and anxiety, who presents to the Emergency Department complaining of subjective fever (triage temp 100.1 F) with generalized body aches for the past 2 weeks, that has gotten worse in the last 3 days. Patient has tried nasal spray with relief of rhinorrhea and nasal congestion for 3 days.  Patient then developed cough and chest congestion 10 days ago. She now reports associated 10/10 HA, worse on the left, that began 3 days ago. She also notes SOB and intermittent cough, but denies taking anything for this. She reports her daughter was sick and patient admits to using daughter's amoxicillin for the past 2 days. Patient is a nonsmoker.   Past Medical History  Diagnosis Date  . Migraine   . Hypertension   . Anxiety    Past Surgical History  Procedure Laterality Date  . Cesarean section     No family history on file. History  Substance Use Topics  . Smoking status: Never Smoker   . Smokeless tobacco: Not on file  . Alcohol Use: No   OB History    No data available     Review of Systems  Constitutional: Positive for fever.  Respiratory: Positive for cough and shortness of breath.   Musculoskeletal: Positive for myalgias.  Neurological: Positive for headaches.  All other systems reviewed and are negative.     Allergies  Pork-derived products  Home Medications   Prior to Admission medications   Medication Sig Start Date End Date Taking? Authorizing Provider  ALPRAZolam Prudy Feeler(XANAX) 0.5 MG tablet Take 1 tablet (0.5 mg  total) by mouth at bedtime as needed for anxiety. 01/15/14   Felicie Mornavid Smith, NP  cetirizine (ZYRTEC) 10 MG tablet Take 10 mg by mouth daily as needed for allergies.     Historical Provider, MD  FLUoxetine (PROZAC) 20 MG tablet Take 20 mg by mouth daily.    Historical Provider, MD  fluticasone (FLONASE) 50 MCG/ACT nasal spray Place 1 spray into both nostrils daily.    Historical Provider, MD  LORazepam (ATIVAN) 1 MG tablet Take 1 tablet (1 mg total) by mouth 3 (three) times daily as needed for anxiety. 01/18/14   Tiffany Neva SeatGreene, PA-C  Melatonin 3 MG TABS Take 1 tablet by mouth at bedtime as needed (for sleep).    Historical Provider, MD  nitroGLYCERIN (NITROSTAT) 0.4 MG SL tablet Place 0.4 mg under the tongue every 5 (five) minutes as needed for chest pain.    Historical Provider, MD  senna-docusate (SENOKOT-S) 8.6-50 MG per tablet Take 1 tablet by mouth at bedtime as needed for mild constipation.    Historical Provider, MD  zolpidem (AMBIEN) 10 MG tablet Take 10 mg by mouth at bedtime as needed for sleep.  02/17/13   Historical Provider, MD   Triage Vitals: BP 143/70 mmHg  Pulse 108  Temp(Src) 100.1 F (37.8 C) (Oral)  Resp 16  SpO2 98%  LMP 05/25/2014  Physical Exam  Constitutional: She is oriented to person, place,  and time. She appears well-developed and well-nourished. No distress.  Appears uncomfortable  HENT:  Head: Normocephalic and atraumatic.  Right Ear: External ear normal.  Left Ear: External ear normal.  Nose: Nose normal.  Mouth/Throat: Oropharynx is clear and moist.  Eyes: Conjunctivae and EOM are normal. Pupils are equal, round, and reactive to light.  Neck: Normal range of motion. Neck supple. No JVD present. No tracheal deviation present. No thyromegaly present.  Cardiovascular: Normal rate, regular rhythm, normal heart sounds and intact distal pulses.  Exam reveals no gallop and no friction rub.   No murmur heard. Pulmonary/Chest: Effort normal and breath sounds normal.  No stridor. No respiratory distress. She has no wheezes. She has no rales. She exhibits no tenderness.  Cough. No wheezing on exam  Abdominal: Soft. Bowel sounds are normal. She exhibits no distension and no mass. There is no tenderness. There is no rebound and no guarding.  Musculoskeletal: Normal range of motion. She exhibits no edema or tenderness.  Lymphadenopathy:    She has no cervical adenopathy.  Neurological: She is alert and oriented to person, place, and time. She displays normal reflexes. No cranial nerve deficit. She exhibits normal muscle tone. Coordination normal.  Skin: Skin is warm and dry. No rash noted. No erythema. No pallor.  Psychiatric: She has a normal mood and affect. Her behavior is normal. Judgment and thought content normal.  Nursing note and vitals reviewed.   ED Course  Procedures (including critical care time)  DIAGNOSTIC STUDIES: Oxygen Saturation is 98% on room air, normal by my interpretation.    COORDINATION OF CARE: At 0141 Discussed treatment plan with patient which includes inhaler, cough suppressant, and Tessalon perles. Patient agrees.   Labs Review Labs Reviewed  CBC - Abnormal; Notable for the following:    Hemoglobin 8.7 (*)    HCT 29.4 (*)    MCV 68.9 (*)    MCH 20.4 (*)    MCHC 29.6 (*)    RDW 17.3 (*)    Platelets 595 (*)    All other components within normal limits  BASIC METABOLIC PANEL - Abnormal; Notable for the following:    Glucose, Bld 110 (*)    All other components within normal limits    Imaging Review Dg Chest 2 View  06/02/2014   CLINICAL DATA:  Mid chest pain, cough and shortness of breath for 3 days.  EXAM: CHEST  2 VIEW  COMPARISON:  Chest radiograph January 17, 2014  FINDINGS: Cardiomediastinal silhouette is unremarkable. Mild bronchitic changes. The lungs are clear without pleural effusions or focal consolidations. Trachea projects midline and there is no pneumothorax. Soft tissue planes and included osseous  structures are non-suspicious.  IMPRESSION: Mild bronchitic changes without focal consolidation.   Electronically Signed   By: Awilda Metro   On: 06/02/2014 01:41     EKG Interpretation None      MDM   Final diagnoses:  Bronchitis  URI (upper respiratory infection)    49 year old female with URI 2 weeks ago, now with persistent cough, malaise, fevers.  Chest x-ray without pneumonia.  Cough and exam consistent with bronchitis.  Will treat symptoms.  Patient has been taking her daughter's amoxicillin, instructed her to stop as she does not have a true bacterial infection but was having viral.  Patient informed of anemia.  She will follow with Dr. Mikeal Hawthorne.  I personally performed the services described in this documentation, which was scribed in my presence. The recorded information has been reviewed and  is accurate.     Marisa Severin, MD 06/02/14 0630

## 2014-06-02 NOTE — ED Notes (Signed)
Pt a/o x 4 on d/c in wheelchair. 

## 2014-08-19 ENCOUNTER — Emergency Department (HOSPITAL_COMMUNITY): Payer: BLUE CROSS/BLUE SHIELD

## 2014-08-19 ENCOUNTER — Emergency Department (HOSPITAL_COMMUNITY)
Admission: EM | Admit: 2014-08-19 | Discharge: 2014-08-19 | Disposition: A | Discharge status: Home / Self Care | Payer: BLUE CROSS/BLUE SHIELD | Attending: Emergency Medicine | Admitting: Emergency Medicine

## 2014-08-19 ENCOUNTER — Encounter (HOSPITAL_COMMUNITY): Payer: Self-pay | Admitting: Neurology

## 2014-08-19 DIAGNOSIS — J039 Acute tonsillitis, unspecified: Secondary | ICD-10-CM | POA: Insufficient documentation

## 2014-08-19 DIAGNOSIS — J209 Acute bronchitis, unspecified: Secondary | ICD-10-CM | POA: Diagnosis not present

## 2014-08-19 DIAGNOSIS — J029 Acute pharyngitis, unspecified: Secondary | ICD-10-CM | POA: Diagnosis present

## 2014-08-19 DIAGNOSIS — I10 Essential (primary) hypertension: Secondary | ICD-10-CM | POA: Insufficient documentation

## 2014-08-19 DIAGNOSIS — J40 Bronchitis, not specified as acute or chronic: Secondary | ICD-10-CM

## 2014-08-19 DIAGNOSIS — F419 Anxiety disorder, unspecified: Secondary | ICD-10-CM | POA: Diagnosis not present

## 2014-08-19 DIAGNOSIS — Z79899 Other long term (current) drug therapy: Secondary | ICD-10-CM | POA: Diagnosis not present

## 2014-08-19 DIAGNOSIS — Z3202 Encounter for pregnancy test, result negative: Secondary | ICD-10-CM | POA: Diagnosis not present

## 2014-08-19 DIAGNOSIS — Z7951 Long term (current) use of inhaled steroids: Secondary | ICD-10-CM | POA: Insufficient documentation

## 2014-08-19 LAB — CBC WITH DIFFERENTIAL/PLATELET
Basophils Absolute: 0 10*3/uL (ref 0.0–0.1)
Basophils Relative: 0 % (ref 0–1)
EOS PCT: 1 % (ref 0–5)
Eosinophils Absolute: 0.1 10*3/uL (ref 0.0–0.7)
HEMATOCRIT: 32.2 % — AB (ref 36.0–46.0)
Hemoglobin: 9.8 g/dL — ABNORMAL LOW (ref 12.0–15.0)
Lymphocytes Relative: 20 % (ref 12–46)
Lymphs Abs: 1.5 10*3/uL (ref 0.7–4.0)
MCH: 20.3 pg — AB (ref 26.0–34.0)
MCHC: 30.4 g/dL (ref 30.0–36.0)
MCV: 66.8 fL — ABNORMAL LOW (ref 78.0–100.0)
MONOS PCT: 13 % — AB (ref 3–12)
Monocytes Absolute: 1 10*3/uL (ref 0.1–1.0)
Neutro Abs: 5 10*3/uL (ref 1.7–7.7)
Neutrophils Relative %: 66 % (ref 43–77)
Platelets: 664 10*3/uL — ABNORMAL HIGH (ref 150–400)
RBC: 4.82 MIL/uL (ref 3.87–5.11)
RDW: 18.5 % — ABNORMAL HIGH (ref 11.5–15.5)
WBC: 7.6 10*3/uL (ref 4.0–10.5)

## 2014-08-19 LAB — COMPREHENSIVE METABOLIC PANEL
ALK PHOS: 67 U/L (ref 38–126)
ALT: 8 U/L — AB (ref 14–54)
AST: 11 U/L — ABNORMAL LOW (ref 15–41)
Albumin: 3.1 g/dL — ABNORMAL LOW (ref 3.5–5.0)
Anion gap: 7 (ref 5–15)
BUN: 10 mg/dL (ref 6–20)
CO2: 24 mmol/L (ref 22–32)
CREATININE: 0.7 mg/dL (ref 0.44–1.00)
Calcium: 8.2 mg/dL — ABNORMAL LOW (ref 8.9–10.3)
Chloride: 102 mmol/L (ref 101–111)
GFR calc Af Amer: >60 mL/min (ref >60)
GFR calc non Af Amer: >60 mL/min (ref >60)
Glucose, Bld: 93 mg/dL (ref 65–99)
Potassium: 3.8 mmol/L (ref 3.5–5.1)
Sodium: 133 mmol/L — ABNORMAL LOW (ref 135–145)
Total Bilirubin: 0.2 mg/dL — ABNORMAL LOW (ref 0.3–1.2)
Total Protein: 6.2 g/dL — ABNORMAL LOW (ref 6.5–8.1)

## 2014-08-19 LAB — RAPID STREP SCREEN (MED CTR MEBANE ONLY): Streptococcus, Group A Screen (Direct): NEGATIVE

## 2014-08-19 LAB — POC URINE PREG, ED: Preg Test, Ur: NEGATIVE

## 2014-08-19 MED ORDER — SODIUM CHLORIDE 0.9 % IV BOLUS (SEPSIS)
1000.0000 mL | Freq: Once | INTRAVENOUS | Status: AC
  Administered 2014-08-19: 1000 mL via INTRAVENOUS

## 2014-08-19 MED ORDER — IOHEXOL 300 MG/ML  SOLN
75.0000 mL | Freq: Once | INTRAMUSCULAR | Status: AC | PRN
  Administered 2014-08-19: 100 mL via INTRAVENOUS

## 2014-08-19 MED ORDER — AZITHROMYCIN 250 MG PO TABS: 250.0000 mg | ORAL_TABLET | Freq: Every day | ORAL | Status: DC

## 2014-08-19 MED ORDER — ACETAMINOPHEN 325 MG PO TABS
325.0000 mg | ORAL_TABLET | Freq: Once | ORAL | Status: AC
  Administered 2014-08-19: 325 mg via ORAL
  Filled 2014-08-19: qty 1

## 2014-08-19 MED ORDER — IBUPROFEN 400 MG PO TABS
400.0000 mg | ORAL_TABLET | Freq: Once | ORAL | Status: AC
  Administered 2014-08-19: 400 mg via ORAL
  Filled 2014-08-19: qty 1

## 2014-08-19 NOTE — Discharge Instructions (Signed)
Please call your doctor for a followup appointment within 24-48 hours. When you talk to your doctor please let them know that you were seen in the emergency department and have them acquire all of your records so that they can discuss the findings with you and formulate a treatment plan to fully care for your new and ongoing problems. Please follow-up with her primary care provider Please follow-up with ear, nose, throat physician Please rest and stay hydrated and drink plenty of fluids Please take antibiotics as prescribed Please continue to monitor symptoms closely and if symptoms are to worsen or change (fever greater than 101, chills, sweating, nausea, vomiting, chest pain, shortness of breathe, difficulty breathing, weakness, numbness, tingling, worsening or changes to pain pattern, throat closing sensation, swelling to the neck, blurred vision, sudden loss of vision, inability keep food fluids down, cough, coughing up blood) please report back to the Emergency Department immediately.   Tonsillitis Tonsillitis is an infection of the throat that causes the tonsils to become red, tender, and swollen. Tonsils are collections of lymphoid tissue at the back of the throat. Each tonsil has crevices (crypts). Tonsils help fight nose and throat infections and keep infection from spreading to other parts of the body for the first 18 months of life.  CAUSES Sudden (acute) tonsillitis is usually caused by infection with streptococcal bacteria. Long-lasting (chronic) tonsillitis occurs when the crypts of the tonsils become filled with pieces of food and bacteria, which makes it easy for the tonsils to become repeatedly infected. SYMPTOMS  Symptoms of tonsillitis include:  A sore throat, with possible difficulty swallowing.  White patches on the tonsils.  Fever.  Tiredness.  New episodes of snoring during sleep, when you did not snore before.  Small, foul-smelling, yellowish-white pieces of material  (tonsilloliths) that you occasionally cough up or spit out. The tonsilloliths can also cause you to have bad breath. DIAGNOSIS Tonsillitis can be diagnosed through a physical exam. Diagnosis can be confirmed with the results of lab tests, including a throat culture. TREATMENT  The goals of tonsillitis treatment include the reduction of the severity and duration of symptoms and prevention of associated conditions. Symptoms of tonsillitis can be improved with the use of steroids to reduce the swelling. Tonsillitis caused by bacteria can be treated with antibiotic medicines. Usually, treatment with antibiotic medicines is started before the cause of the tonsillitis is known. However, if it is determined that the cause is not bacterial, antibiotic medicines will not treat the tonsillitis. If attacks of tonsillitis are severe and frequent, your health care provider may recommend surgery to remove the tonsils (tonsillectomy). HOME CARE INSTRUCTIONS   Rest as much as possible and get plenty of sleep.  Drink plenty of fluids. While the throat is very sore, eat soft foods or liquids, such as sherbet, soups, or instant breakfast drinks.  Eat frozen ice pops.  Gargle with a warm or cold liquid to help soothe the throat. Mix 1/4 teaspoon of salt and 1/4 teaspoon of baking soda in 8 oz of water. SEEK MEDICAL CARE IF:   Large, tender lumps develop in your neck.  A rash develops.  A green, yellow-brown, or bloody substance is coughed up.  You are unable to swallow liquids or food for 24 hours.  You notice that only one of the tonsils is swollen. SEEK IMMEDIATE MEDICAL CARE IF:   You develop any new symptoms such as vomiting, severe headache, stiff neck, chest pain, or trouble breathing or swallowing.  You have  severe throat pain along with drooling or voice changes.  You have severe pain, unrelieved with recommended medications.  You are unable to fully open the mouth.  You develop redness,  swelling, or severe pain anywhere in the neck.  You have a fever. MAKE SURE YOU:   Understand these instructions.  Will watch your condition.  Will get help right away if you are not doing well or get worse. Document Released: 12/20/2004 Document Revised: 07/27/2013 Document Reviewed: 08/29/2012 [facility] Patient Information 2015 [place], [place]. This information is not intended to replace advice given to you by your health care provider. Make sure you discuss any questions you have with your health care provider.  Acute Bronchitis Bronchitis is inflammation of the airways that extend from the windpipe into the lungs (bronchi). The inflammation often causes mucus to develop. This leads to a cough, which is the most common symptom of bronchitis.  In acute bronchitis, the condition usually develops suddenly and goes away over time, usually in a couple weeks. Smoking, allergies, and asthma can make bronchitis worse. Repeated episodes of bronchitis may cause further lung problems.  CAUSES Acute bronchitis is most often caused by the same virus that causes a cold. The virus can spread from person to person (contagious) through coughing, sneezing, and touching contaminated objects. SIGNS AND SYMPTOMS   Cough.   Fever.   Coughing up mucus.   Body aches.   Chest congestion.   Chills.   Shortness of breath.   Sore throat.  DIAGNOSIS  Acute bronchitis is usually diagnosed through a physical exam. Your health care provider will also ask you questions about your medical history. Tests, such as chest X-rays, are sometimes done to rule out other conditions.  TREATMENT  Acute bronchitis usually goes away in a couple weeks. Oftentimes, no medical treatment is necessary. Medicines are sometimes given for relief of fever or cough. Antibiotic medicines are usually not needed but may be prescribed in certain situations. In some cases, an inhaler may be recommended to help reduce shortness of  breath and control the cough. A cool mist vaporizer may also be used to help thin bronchial secretions and make it easier to clear the chest.  HOME CARE INSTRUCTIONS  Get plenty of rest.   Drink enough fluids to keep your urine clear or pale yellow (unless you have a medical condition that requires fluid restriction). Increasing fluids may help thin your respiratory secretions (sputum) and reduce chest congestion, and it will prevent dehydration.   Take medicines only as directed by your health care provider.  If you were prescribed an antibiotic medicine, finish it all even if you start to feel better.  Avoid smoking and secondhand smoke. Exposure to cigarette smoke or irritating chemicals will make bronchitis worse. If you are a smoker, consider using nicotine gum or skin patches to help control withdrawal symptoms. Quitting smoking will help your lungs heal faster.   Reduce the chances of another bout of acute bronchitis by washing your hands frequently, avoiding people with cold symptoms, and trying not to touch your hands to your mouth, nose, or eyes.   Keep all follow-up visits as directed by your health care provider.  SEEK MEDICAL CARE IF: Your symptoms do not improve after 1 week of treatment.  SEEK IMMEDIATE MEDICAL CARE IF:  You develop an increased fever or chills.   You have chest pain.   You have severe shortness of breath.  You have bloody sputum.   You develop dehydration.  You  faint or repeatedly feel like you are going to pass out.  You develop repeated vomiting.  You develop a severe headache. MAKE SURE YOU:   Understand these instructions.  Will watch your condition.  Will get help right away if you are not doing well or get worse. Document Released: 04/19/2004 Document Revised: 07/27/2013 Document Reviewed: 09/02/2012 [facility] Patient Information 2015 [place], [place]. This information is not intended to replace advice given to you by your health  care provider. Make sure you discuss any questions you have with your health care provider.

## 2014-08-19 NOTE — ED Notes (Signed)
Pt at CT

## 2014-08-19 NOTE — ED Notes (Signed)
Pt stable, ambulatory, states understanding of discharge instructions 

## 2014-08-19 NOTE — ED Provider Notes (Signed)
CSN: [id number]     Arrival date & time 08/19/14  1636 History  This chart was scribed for [employer], working with Rolland Porter, MD by Placido Sou, ED Scribe. This patient was seen in room TR07C/TR07C and the patient's care was started at 6:43 PM.     Chief Complaint  Patient presents with  . Sore Throat  . Fever    The history is provided by the patient. No language interpreter was used.    HPI Comments: Samantha Logan is a 49 y.o. female, with a history of migraine, HTN, and anxiety, who presents to the Emergency Department complaining of a worsening sore throat with onset 7 days prior.  Pt has a very hard time speaking and difficulty drinking water, eating, and swallowing saliva secondary to pain. Pt notes yellow mucus being coughed up. Pt confirms associated HA, subjective fever (Triage temp 100.3 F), and neck swelling. Pt additionally describes the pain as deep. Pt confirmed taking amoxicillin 3 days prior with last dosage at 8:00 am this morning. Pt has taken no other additional medications and confirms no drug allergies. Pt also notes that her two children are showing no signs of sickness and weren't sick beforehand. Pt denies blurred vision, sudden loss of vision, ear pain, coughing up blood, SOB, CP, difficulty breathing, nausea, vomiting, and wheezing. PCP: Dr. Mikeal Hawthorne   Past Medical History  Diagnosis Date  . Migraine   . Hypertension   . Anxiety    Past Surgical History  Procedure Laterality Date  . Cesarean section     No family history on file. History  Substance Use Topics  . Smoking status: Never Smoker   . Smokeless tobacco: Not on file  . Alcohol Use: No   OB History    No data available     Review of Systems  Constitutional: Positive for fever. Negative for chills.  HENT: Positive for sore throat and trouble swallowing.   Eyes: Negative for visual disturbance.  Respiratory: Negative for chest tightness and shortness of breath.   Cardiovascular:  Negative for chest pain.  Musculoskeletal: Positive for neck pain. Negative for neck stiffness.  Neurological: Positive for headaches.      Allergies  Pork-derived products  Home Medications   Prior to Admission medications   Medication Sig Start Date End Date Taking? Authorizing Provider  albuterol (PROVENTIL HFA;VENTOLIN HFA) 108 (90 BASE) MCG/ACT inhaler Inhale 1-2 puffs into the lungs every 4 (four) hours as needed for wheezing or shortness of breath. 06/02/14   Marisa Severin, MD  ALPRAZolam Prudy Feeler) 0.5 MG tablet Take 1 tablet (0.5 mg total) by mouth at bedtime as needed for anxiety. 01/15/14   Felicie Morn, NP  azithromycin (ZITHROMAX) 250 MG tablet Take 1 tablet (250 mg total) by mouth daily. Take first 2 tablets together, then 1 every day until finished. 08/19/14   Debanhi Blaker, PA-C  benzonatate (TESSALON) 200 MG capsule Take 1 capsule (200 mg total) by mouth once. 06/02/14   Marisa Severin, MD  cetirizine (ZYRTEC) 10 MG tablet Take 10 mg by mouth daily as needed for allergies.     Historical Provider, MD  FLUoxetine (PROZAC) 20 MG tablet Take 20 mg by mouth daily.    Historical Provider, MD  fluticasone (FLONASE) 50 MCG/ACT nasal spray Place 1 spray into both nostrils daily.    Historical Provider, MD  guaiFENesin-dextromethorphan (ROBITUSSIN DM) 100-10 MG/5ML syrup Take 5 mLs by mouth every 4 (four) hours as needed for cough. 06/02/14   Marian Sorrow  Norlene Campbell, MD  ibuprofen (ADVIL,MOTRIN) 800 MG tablet Take 1 tablet (800 mg total) by mouth every 8 (eight) hours as needed for fever, headache or moderate pain. 06/02/14   Marisa Severin, MD  LORazepam (ATIVAN) 1 MG tablet Take 1 tablet (1 mg total) by mouth 3 (three) times daily as needed for anxiety. 01/18/14   Tiffany Neva Seat, PA-C  Melatonin 3 MG TABS Take 1 tablet by mouth at bedtime as needed (for sleep).    Historical Provider, MD  nitroGLYCERIN (NITROSTAT) 0.4 MG SL tablet Place 0.4 mg under the tongue every 5 (five) minutes as needed for chest pain.     Historical Provider, MD  senna-docusate (SENOKOT-S) 8.6-50 MG per tablet Take 1 tablet by mouth at bedtime as needed for mild constipation.    Historical Provider, MD  zolpidem (AMBIEN) 10 MG tablet Take 10 mg by mouth at bedtime as needed for sleep.  02/17/13   Historical Provider, MD   BP 110/90 mmHg  Pulse 85  Temp(Src) 98.2 F (36.8 C) (Oral)  Resp 22  SpO2 100%  LMP 08/08/2014 Physical Exam  Constitutional: She is oriented to person, place, and time. She appears well-developed and well-nourished. No distress.  HENT:  Head: Normocephalic and atraumatic.  Negative facial swelling. Negative swelling, erythema, inflammation, lesions, sores, deformities identified to the tonsils bilaterally. Negative exudate or petechiae identified to the posterior oropharynx. Uvula midline with symmetrical elevation-negative uvula deviation. Negative trismus. Tenderness upon palpation to the tonsils bilaterally. Negative sublingual lesions.  Eyes: Conjunctivae and EOM are normal. Pupils are equal, round, and reactive to light. Right eye exhibits no discharge. Left eye exhibits no discharge.  Neck: Normal range of motion. Neck supple.  Negative neck stiffness Negative nuchal rigidity Negative cervical lymphadenopathy Negative meningeal signs  Tenderness upon palpation to the neck circumferentially  Cardiovascular: Normal rate, regular rhythm and normal heart sounds.  Exam reveals no friction rub.   No murmur heard. Pulses:      Radial pulses are 2+ on the right side, and 2+ on the left side.  Pulmonary/Chest: Effort normal and breath sounds normal. No respiratory distress. She has no wheezes. She has no rales.  Musculoskeletal: Normal range of motion.  Neurological: She is alert and oriented to person, place, and time. No cranial nerve deficit. She exhibits normal muscle tone. Coordination normal. GCS eye subscore is 4. GCS verbal subscore is 5. GCS motor subscore is 6.  Skin: Skin is warm and dry. No  rash noted. She is not diaphoretic. No erythema.  Psychiatric: She has a normal mood and affect. Her behavior is normal. Thought content normal.  Nursing note and vitals reviewed.   ED Course  Procedures  DIAGNOSTIC STUDIES: Oxygen Saturation is 97% on RA, normal by my interpretation.    COORDINATION OF CARE: 6:50 PM Discussed treatment plan with pt at bedside including a scan of the neck. Pt agreed to plan.  Results for orders placed or performed during the hospital encounter of 08/19/14  Rapid strep screen   (If patient has fever and/or without cough or runny nose)  Result Value Ref Range   Streptococcus, Group A Screen (Direct) NEGATIVE NEGATIVE  CBC with Differential/Platelet  Result Value Ref Range   WBC 7.6 4.0 - 10.5 K/uL   RBC 4.82 3.87 - 5.11 MIL/uL   Hemoglobin 9.8 (L) 12.0 - 15.0 g/dL   HCT 69.6 (L) 29.5 - 28.4 %   MCV 66.8 (L) 78.0 - 100.0 fL   MCH 20.3 (L) 26.0 - 34.0  pg   MCHC 30.4 30.0 - 36.0 g/dL   RDW 16.1 (H) 09.6 - 04.5 %   Platelets 664 (H) 150 - 400 K/uL   Neutrophils Relative % 66 43 - 77 %   Lymphocytes Relative 20 12 - 46 %   Monocytes Relative 13 (H) 3 - 12 %   Eosinophils Relative 1 0 - 5 %   Basophils Relative 0 0 - 1 %   Neutro Abs 5.0 1.7 - 7.7 K/uL   Lymphs Abs 1.5 0.7 - 4.0 K/uL   Monocytes Absolute 1.0 0.1 - 1.0 K/uL   Eosinophils Absolute 0.1 0.0 - 0.7 K/uL   Basophils Absolute 0.0 0.0 - 0.1 K/uL   RBC Morphology ELLIPTOCYTES   Comprehensive metabolic panel  Result Value Ref Range   Sodium 133 (L) 135 - 145 mmol/L   Potassium 3.8 3.5 - 5.1 mmol/L   Chloride 102 101 - 111 mmol/L   CO2 24 22 - 32 mmol/L   Glucose, Bld 93 65 - 99 mg/dL   BUN 10 6 - 20 mg/dL   Creatinine, Ser 4.09 0.44 - 1.00 mg/dL   Calcium 8.2 (L) 8.9 - 10.3 mg/dL   Total Protein 6.2 (L) 6.5 - 8.1 g/dL   Albumin 3.1 (L) 3.5 - 5.0 g/dL   AST 11 (L) 15 - 41 U/L   ALT 8 (L) 14 - 54 U/L   Alkaline Phosphatase 67 38 - 126 U/L   Total Bilirubin 0.2 (L) 0.3 - 1.2 mg/dL    GFR calc non Af Amer >60 >60 mL/min   GFR calc Af Amer >60 >60 mL/min   Anion gap 7 5 - 15  POC urine preg, ED (not at [facility])  Result Value Ref Range   Preg Test, Ur NEGATIVE NEGATIVE    Labs Review Labs Reviewed  CBC WITH DIFFERENTIAL/PLATELET - Abnormal; Notable for the following:    Hemoglobin 9.8 (*)    HCT 32.2 (*)    MCV 66.8 (*)    MCH 20.3 (*)    RDW 18.5 (*)    Platelets 664 (*)    Monocytes Relative 13 (*)    All other components within normal limits  COMPREHENSIVE METABOLIC PANEL - Abnormal; Notable for the following:    Sodium 133 (*)    Calcium 8.2 (*)    Total Protein 6.2 (*)    Albumin 3.1 (*)    AST 11 (*)    ALT 8 (*)    Total Bilirubin 0.2 (*)    All other components within normal limits  RAPID STREP SCREEN (NOT AT [facility])  CULTURE, GROUP A STREP  POC URINE PREG, ED    Imaging Review Dg Chest 2 View  08/19/2014   CLINICAL DATA:  Fever and pharyngitis.  Shortness of breath.  EXAM: CHEST  2 VIEW  COMPARISON:  06/02/2014.  FINDINGS: Normal sized heart. Clear lungs. Mild diffuse peribronchial thickening with little change. Mild thoracic spine degenerative changes.  IMPRESSION: Mild bronchitic changes without significant change.   Electronically Signed   By: Beckie Salts M.D.   On: 08/19/2014 19:47   Ct Soft Tissue Neck W Contrast  08/19/2014   CLINICAL DATA:  Bilateral neck pain. Unable to swallow for the past week.  EXAM: CT NECK WITH CONTRAST  TECHNIQUE: Multidetector CT imaging of the neck was performed using the standard protocol following the bolus administration of intravenous contrast.  CONTRAST:  OMNIPAQUE IOHEXOL 300 MG/ML  SOLN  COMPARISON:  Maxillofacial CT dated 06/03/2012  and head CT dated 09/24/2008.  FINDINGS: Pharynx and larynx: The tonsillar pillars are diffusely enlarged bilaterally, specially the lingual tonsils. No abscess seen.  Salivary glands: Unremarkable.  Thyroid: Small nodule in each lobe. The largest was on the left, measuring 4 mm on  image number 84.  Lymph nodes: Multiple mildly enlarged cervical lymph nodes bilaterally. The largest on the left is a jugulodigastric node measuring 13 mm in short axis diameter on image number 52 in the largest on the right is 8 jugular deck digastric node with a short axis diameter of 12 mm on image number 48.  Vascular: Unremarkable.  Limited intracranial: Unremarkable.  Visualized orbits: Unremarkable.  Mastoids and visualized paranasal sinuses: Normally pneumatized.  Skeleton: Mild anterior cervical spur formation at multiple levels.  Upper chest: Clear lung apices.  IMPRESSION: 1. Prominent tonsils bilaterally without abscess. 2. Mild bilateral cervical adenopathy, most likely reactive.   Electronically Signed   By: Beckie SaltsSteven  Reid M.D.   On: 08/19/2014 20:27     EKG Interpretation None      MDM   Final diagnoses:  Bronchitis  Tonsillitis    Medications  acetaminophen (TYLENOL) tablet 325 mg (325 mg Oral Given 08/19/14 1857)  sodium chloride 0.9 % bolus 1,000 mL (0 mLs Intravenous Stopped 08/19/14 2042)  iohexol (OMNIPAQUE) 300 MG/ML solution 75 mL (100 mLs Intravenous Contrast Given 08/19/14 1951)  sodium chloride 0.9 % bolus 1,000 mL (0 mLs Intravenous Stopped 08/19/14 2222)  ibuprofen (ADVIL,MOTRIN) tablet 400 mg (400 mg Oral Given 08/19/14 2226)   Filed Vitals:   08/19/14 1649 08/19/14 1731 08/19/14 2050 08/19/14 2227  BP: 115/64 98/54 107/53 110/90  Pulse: 102 97 84 85  Temp: 100.3 F (37.9 C) 100.3 F (37.9 C) 99 F (37.2 C) 98.2 F (36.8 C)  TempSrc: Oral Oral Oral Oral  Resp: 20 18 22 22   SpO2: 97% 100% 100% 100%   I personally performed the services described in this documentation, which was scribed in my presence. The recorded information has been reviewed and is accurate.   CBC negative elevated leukocytosis-hemoglobin 9.8, hematocrit 32.2 - when compared to previous labs 2 months ago patient's hemoglobin was 8.7 and hematocrit was 29.4. CMP noted mildly low sodium of  133-kidney functioning well as well as liver-negative elevated liver enzymes or bilirubin. Negative elevated anion gap. Urine pregnancy negative. Rapid strep negative. CT soft tissue of neck noted prominent tonsils bilaterally without abscess, mild bilateral cervical adenopathy most likely reactive. Chest x-ray noted mild bronchitis changes without significant change.  negative findings of peritonsillar abscess. Negative findings of retropharyngeal abscess. Negative findings of Ludwig's angina. Chest x-ray negative for pneumonia. Possible beginnings of bronchitis. Patient given IV fluids and Tylenol in ED setting. Patient tolerated by mouth without difficulty-negative episodes of emesis in ED setting. Patient stable, afebrile. Patient not septic appearing. Negative signs of respiratory distress. Airway intact. Discussed case and reviewed labs and imaging in great detail with attending physician who agreed to plan of discharge. Discharged patient. Discharge patient with referral to PCP. Discussed with patient to rest and stay hydrated. Discussed with patient to closely monitor symptoms and if symptoms are to worsen or change to report back to the ED - strict return instructions given.  Patient agreed to plan of care, understood, all questions answered.   Raymon MuttonMarissa Angelo Prindle, PA-C 08/19/14 2233  Rolland PorterMark James, MD 08/27/14 [phone]

## 2014-08-19 NOTE — ED Notes (Signed)
Pt reports sore throat, fever for 1 week. Took amoxicillin that she had a home from a previous illness. Is not feeling better. Airway intact. No respiratory distress. Family at bedside.

## 2014-08-21 LAB — CULTURE, GROUP A STREP: Strep A Culture: NEGATIVE

## 2014-10-27 ENCOUNTER — Encounter (HOSPITAL_COMMUNITY): Payer: Self-pay | Admitting: *Deleted

## 2014-10-27 ENCOUNTER — Emergency Department (HOSPITAL_COMMUNITY)
Admission: EM | Admit: 2014-10-27 | Discharge: 2014-10-28 | Disposition: A | Discharge status: Home / Self Care | Payer: BLUE CROSS/BLUE SHIELD | Attending: Emergency Medicine | Admitting: Emergency Medicine

## 2014-10-27 DIAGNOSIS — D509 Iron deficiency anemia, unspecified: Secondary | ICD-10-CM | POA: Insufficient documentation

## 2014-10-27 DIAGNOSIS — R0602 Shortness of breath: Secondary | ICD-10-CM | POA: Diagnosis not present

## 2014-10-27 DIAGNOSIS — R002 Palpitations: Secondary | ICD-10-CM | POA: Diagnosis not present

## 2014-10-27 DIAGNOSIS — I10 Essential (primary) hypertension: Secondary | ICD-10-CM | POA: Diagnosis present

## 2014-10-27 DIAGNOSIS — Z79899 Other long term (current) drug therapy: Secondary | ICD-10-CM | POA: Insufficient documentation

## 2014-10-27 DIAGNOSIS — F419 Anxiety disorder, unspecified: Secondary | ICD-10-CM | POA: Diagnosis not present

## 2014-10-27 DIAGNOSIS — G43909 Migraine, unspecified, not intractable, without status migrainosus: Secondary | ICD-10-CM | POA: Insufficient documentation

## 2014-10-27 DIAGNOSIS — D500 Iron deficiency anemia secondary to blood loss (chronic): Secondary | ICD-10-CM

## 2014-10-27 DIAGNOSIS — D649 Anemia, unspecified: Secondary | ICD-10-CM | POA: Diagnosis not present

## 2014-10-27 NOTE — ED Notes (Signed)
The pt is c/o her bp being high since yesterday.  She does not take bp meds

## 2014-10-27 NOTE — ED Provider Notes (Addendum)
CSN: [id number]     Arrival date & time 10/27/14  2332 History  This chart was scribed for Gilda Crease, MD by Evon Slack, ED Scribe. This patient was seen in room D35C/D35C and the patient's care was started at 11:54 PM.      Chief Complaint  Patient presents with  . Hypertension   Patient is a 49 y.o. female presenting with chest pain. The history is provided by the patient.  Chest Pain Pain location:  Substernal area Pain radiates to:  L arm Pain radiates to the back: no   Pain severity:  Mild Duration:  1 day Timing:  Constant Progression:  Unchanged Chronicity:  New Relieved by:  Nothing Worsened by:  Nothing tried Ineffective treatments:  None tried Associated symptoms: no fever, no nausea and not vomiting    HPI Comments: Samantha Logan is a 49 y.o. female who presents to the Emergency Department complaining of weakness and uncomfortable sensation in chest that radiates to the left arm onset 1 day prior. Pt states that she feels as if her BP is elevated. Reports feeling slightly SOB. Pt denies being on any medications for her BP. Pt states " I just don't feel good" while continuously pointing to her chest. Pt doesn't report ay other symptoms.   Past Medical History  Diagnosis Date  . Migraine   . Hypertension   . Anxiety    Past Surgical History  Procedure Laterality Date  . Cesarean section     Family History  Problem Relation Age of Onset  . Hypertension Mother    History  Substance Use Topics  . Smoking status: Never Smoker   . Smokeless tobacco: Not on file  . Alcohol Use: No   OB History    No data available      Review of Systems  Constitutional: Negative for fever.  Cardiovascular: Positive for chest pain.  Gastrointestinal: Negative for nausea and vomiting.  All other systems reviewed and are negative.    Allergies  Pork-derived products  Home Medications   Prior to Admission medications   Medication Sig Start Date End Date  Taking? Authorizing Provider  diphenhydramine-acetaminophen (TYLENOL PM) 25-500 MG TABS Take 1 tablet by mouth at bedtime as needed (for sleep).   Yes Historical Provider, MD  Melatonin 3 MG TABS Take 1 tablet by mouth at bedtime as needed (for sleep).   Yes Historical Provider, MD  nitroGLYCERIN (NITROSTAT) 0.4 MG SL tablet Place 0.4 mg under the tongue every 5 (five) minutes as needed for chest pain.   Yes Historical Provider, MD  albuterol (PROVENTIL HFA;VENTOLIN HFA) 108 (90 BASE) MCG/ACT inhaler Inhale 1-2 puffs into the lungs every 4 (four) hours as needed for wheezing or shortness of breath. Patient not taking: Reported on 10/28/2014 06/02/14   Marisa Severin, MD  ALPRAZolam Prudy Feeler) 0.5 MG tablet Take 1 tablet (0.5 mg total) by mouth at bedtime as needed for anxiety. Patient not taking: Reported on 10/28/2014 01/15/14   Felicie Morn, NP  azithromycin (ZITHROMAX) 250 MG tablet Take 1 tablet (250 mg total) by mouth daily. Take first 2 tablets together, then 1 every day until finished. Patient not taking: Reported on 10/28/2014 08/19/14   Marissa Sciacca, PA-C  benzonatate (TESSALON) 200 MG capsule Take 1 capsule (200 mg total) by mouth once. Patient not taking: Reported on 10/28/2014 06/02/14   Marisa Severin, MD  guaiFENesin-dextromethorphan [facility] DM) 100-10 MG/5ML syrup Take 5 mLs by mouth every 4 (four) hours as needed for cough.  Patient not taking: Reported on 10/28/2014 06/02/14   Marisa Severin, MD  ibuprofen (ADVIL,MOTRIN) 800 MG tablet Take 1 tablet (800 mg total) by mouth every 8 (eight) hours as needed for fever, headache or moderate pain. Patient not taking: Reported on 10/28/2014 06/02/14   Marisa Severin, MD  LORazepam (ATIVAN) 1 MG tablet Take 1 tablet (1 mg total) by mouth 3 (three) times daily as needed for anxiety. Patient not taking: Reported on 10/28/2014 01/18/14   Marlon Pel, PA-C   BP 110/69 mmHg  Pulse 82  Temp(Src) 98.8 F (37.1 C) (Oral)  Resp 22  Ht  (1.575 m)  Wt 229 lb 1 oz (103.902  kg)  BMI 41.89 kg/m2  SpO2 100%  LMP 10/27/2014   Physical Exam  Constitutional: She is oriented to person, place, and time. She appears well-developed and well-nourished. No distress.  HENT:  Head: Normocephalic and atraumatic.  Right Ear: Hearing normal.  Left Ear: Hearing normal.  Nose: Nose normal.  Mouth/Throat: Oropharynx is clear and moist and mucous membranes are normal.  Eyes: Conjunctivae and EOM are normal. Pupils are equal, round, and reactive to light.  Neck: Normal range of motion. Neck supple.  Cardiovascular: Regular rhythm, S1 normal and S2 normal.  Exam reveals no gallop and no friction rub.   No murmur heard. Pulmonary/Chest: Effort normal and breath sounds normal. No respiratory distress. She exhibits no tenderness.  Abdominal: Soft. Normal appearance and bowel sounds are normal. There is no hepatosplenomegaly. There is no tenderness. There is no rebound, no guarding, no tenderness at McBurney's point and negative Murphy's sign. No hernia.  Musculoskeletal: Normal range of motion.  Neurological: She is alert and oriented to person, place, and time. She has normal strength. No cranial nerve deficit or sensory deficit. Coordination normal. GCS eye subscore is 4. GCS verbal subscore is 5. GCS motor subscore is 6.  Skin: Skin is warm, dry and intact. No rash noted. No cyanosis.  Psychiatric: She has a normal mood and affect. Her speech is normal and behavior is normal. Thought content normal.  Nursing note and vitals reviewed.   ED Course  Procedures (including critical care time) DIAGNOSTIC STUDIES: Oxygen Saturation is 100% on RA, normal by my interpretation.    COORDINATION OF CARE: 12:00 AM-Discussed treatment plan with pt at bedside and pt agreed to plan.     Labs Review Labs Reviewed  CBC WITH DIFFERENTIAL/PLATELET - Abnormal; Notable for the following:    Hemoglobin 8.4 (*)    HCT 28.3 (*)    MCV 67.7 (*)    MCH 20.1 (*)    MCHC 29.7 (*)    RDW 18.2  (*)    Platelets 617 (*)    All other components within normal limits  COMPREHENSIVE METABOLIC PANEL - Abnormal; Notable for the following:    Calcium 8.7 (*)    Total Protein 6.2 (*)    Albumin 3.3 (*)    ALT 7 (*)    Total Bilirubin 0.2 (*)    All other components within normal limits  HEMOGLOBIN AND HEMATOCRIT, BLOOD - Abnormal; Notable for the following:    Hemoglobin 8.1 (*)    HCT 27.8 (*)    All other components within normal limits  TROPONIN I  BRAIN NATRIURETIC PEPTIDE  TROPONIN I  D-DIMER, QUANTITATIVE (NOT AT [facility])    Imaging Review Dg Chest 2 View  10/28/2014   CLINICAL DATA:  Chest pain and weakness.  EXAM: CHEST  2 VIEW  COMPARISON:  08/19/2014  FINDINGS: The heart size and mediastinal contours are within normal limits. Both lungs are clear. The visualized skeletal structures are unremarkable.  IMPRESSION: No active cardiopulmonary disease.   Electronically Signed   By: Ellery Plunk M.D.   On: 10/28/2014 01:11     EKG Interpretation   Date/Time:  Thursday October 28 2014 00:12:50 EDT Ventricular Rate:  92 PR Interval:  180 QRS Duration: 75 QT Interval:  357 QTC Calculation: 442 R Axis:   53 Text Interpretation:  Sinus rhythm Low voltage, precordial leads No  significant change since last tracing Confirmed by POLLINA  MD,  CHRISTOPHER [id number]) on 10/28/2014 12:38:42 AM      MDM   Final diagnoses:  Shortness of breath  Palpitations   Patient resents to the emergency department for evaluation of vague chest symptoms. Patient reports that she needed her blood pressure was elevated because she had uncomfortable sensation in her chest and was feeling weak. She reports that she "just does not feel well". Further discussions reveal that she is mostly feeling palpitations. Her initial workup was unremarkable. Case discussed with Dr. Allena Katz, on call for hospitalist. He interviewed the patient extensively and did not feel that she required admission. He recommended  repeat troponin, d-dimer which were performed. Both of these were negative. Patient's blood pressure has been mostly normotensive here in the ER, although slightly elevated at arrival. She likely is very anxious. Additionally, however, she is anemic. She has a history of chronic anemia secondary to heavy menstrual bleeding. Patient will be prescribed iron supplementation. Patient will be discharged and told to follow-up with her PCP, Dr. Mikeal Hawthorne as soon as possible.   I personally performed the services described in this documentation, which was scribed in my presence. The recorded information has been reviewed and is accurate.       Gilda Crease, MD 10/28/14 0559  Gilda Crease, MD 10/28/14 [phone]

## 2014-10-28 ENCOUNTER — Emergency Department (HOSPITAL_COMMUNITY): Payer: BLUE CROSS/BLUE SHIELD

## 2014-10-28 ENCOUNTER — Encounter (HOSPITAL_COMMUNITY): Payer: Self-pay | Admitting: Internal Medicine

## 2014-10-28 DIAGNOSIS — R002 Palpitations: Secondary | ICD-10-CM | POA: Diagnosis not present

## 2014-10-28 DIAGNOSIS — D649 Anemia, unspecified: Secondary | ICD-10-CM | POA: Diagnosis not present

## 2014-10-28 LAB — COMPREHENSIVE METABOLIC PANEL
ALK PHOS: 76 U/L (ref 38–126)
ALT: 7 U/L — AB (ref 14–54)
ANION GAP: 8 (ref 5–15)
AST: 15 U/L (ref 15–41)
Albumin: 3.3 g/dL — ABNORMAL LOW (ref 3.5–5.0)
BUN: 10 mg/dL (ref 6–20)
CHLORIDE: 103 mmol/L (ref 101–111)
CO2: 26 mmol/L (ref 22–32)
CREATININE: 0.93 mg/dL (ref 0.44–1.00)
Calcium: 8.7 mg/dL — ABNORMAL LOW (ref 8.9–10.3)
GLUCOSE: 99 mg/dL (ref 65–99)
POTASSIUM: 3.6 mmol/L (ref 3.5–5.1)
SODIUM: 137 mmol/L (ref 135–145)
TOTAL PROTEIN: 6.2 g/dL — AB (ref 6.5–8.1)
Total Bilirubin: 0.2 mg/dL — ABNORMAL LOW (ref 0.3–1.2)

## 2014-10-28 LAB — CBC WITH DIFFERENTIAL/PLATELET
Basophils Absolute: 0 10*3/uL (ref 0.0–0.1)
Basophils Relative: 0 % (ref 0–1)
Eosinophils Absolute: 0.1 10*3/uL (ref 0.0–0.7)
Eosinophils Relative: 1 % (ref 0–5)
HCT: 28.3 % — ABNORMAL LOW (ref 36.0–46.0)
Hemoglobin: 8.4 g/dL — ABNORMAL LOW (ref 12.0–15.0)
Lymphocytes Relative: 27 % (ref 12–46)
Lymphs Abs: 2.3 10*3/uL (ref 0.7–4.0)
MCH: 20.1 pg — ABNORMAL LOW (ref 26.0–34.0)
MCHC: 29.7 g/dL — AB (ref 30.0–36.0)
MCV: 67.7 fL — AB (ref 78.0–100.0)
MONO ABS: 0.7 10*3/uL (ref 0.1–1.0)
MONOS PCT: 8 % (ref 3–12)
NEUTROS ABS: 5.3 10*3/uL (ref 1.7–7.7)
NEUTROS PCT: 64 % (ref 43–77)
Platelets: 617 10*3/uL — ABNORMAL HIGH (ref 150–400)
RBC: 4.18 MIL/uL (ref 3.87–5.11)
RDW: 18.2 % — AB (ref 11.5–15.5)
WBC: 8.4 10*3/uL (ref 4.0–10.5)

## 2014-10-28 LAB — HEMOGLOBIN AND HEMATOCRIT, BLOOD
HCT: 27.8 % — ABNORMAL LOW (ref 36.0–46.0)
Hemoglobin: 8.1 g/dL — ABNORMAL LOW (ref 12.0–15.0)

## 2014-10-28 LAB — D-DIMER, QUANTITATIVE: D-Dimer, Quant: 0.27 ug/mL-FEU (ref 0.00–0.48)

## 2014-10-28 LAB — TROPONIN I: Troponin I: <0.03 ng/mL (ref <0.031)

## 2014-10-28 LAB — BRAIN NATRIURETIC PEPTIDE: B Natriuretic Peptide: 20.8 pg/mL (ref 0.0–100.0)

## 2014-10-28 MED ORDER — LABETALOL HCL 5 MG/ML IV SOLN: 10.0000 mg | Freq: Once | INTRAVENOUS | Status: DC

## 2014-10-28 MED ORDER — FERROUS SULFATE 325 (65 FE) MG PO TABS: 325.0000 mg | ORAL_TABLET | Freq: Two times a day (BID) | ORAL | Status: DC

## 2014-10-28 MED ORDER — ASPIRIN 81 MG PO CHEW
324.0000 mg | CHEWABLE_TABLET | Freq: Once | ORAL | Status: AC
  Administered 2014-10-28: 162 mg via ORAL
  Filled 2014-10-28: qty 4

## 2014-10-28 NOTE — ED Notes (Signed)
Admitting MD at bedside at this time.

## 2014-10-28 NOTE — Consult Note (Signed)
[facility] Initial Consult Note   Samantha Logan  ZOX:[id number]  DOB: 03-May-1965  DOA: 10/27/2014 DOS: the patient was seen and examined on 10/28/2014  PCP: Lonia Blood, MD   Referring physician: Dr. Oletta Cohn Reason for consult: admission  HPI: Samantha Logan is a 49 y.o. female with Past medical history of anxiety. Patient is coming from home Patient does speak [place] but arabic interpreter was used to communicate better. The patient presented to ER with the complaints of having "high blood pressure". The patient mentions that since last one week she has been having significant fatigue and lethargy and will get tired very easily. She also mentions that she is having "high blood pressure today",while pointing to lower her left side of the chest. And states that this has been going "up and down all day".  Patient denies having any fever, chills, cough, nausea, vomiting, abdominal pain, diarrhea, constipation, burning urination. She mentions she has hemorrhoids and has on and off of bleeding. She mentions she has periods every 3 weeks and each. Would last for 6 days and she will use 6 pads at least on the first day of her periods. She denies any leg swelling or recent travel recent surgeries and procedure. She only takes milk of magnesia at home and does not take any other medications at home. She denies having any family history of coronary artery disease. Her mother has high blood pressure. Her husband has diabetes. She denies any active smoking or alcohol abuse. She denies any drug abuse.  Review of Systems: as mentioned in the history of present illness.  A Comprehensive review of the other systems is negative.  Past Medical History  Diagnosis Date  . Migraine   . Hypertension   . Anxiety    Past Surgical History  Procedure Laterality Date  . Cesarean section     Social History:  reports that she has never smoked. She does not have any smokeless tobacco history on file.  She reports that she does not drink alcohol or use illicit drugs.  Allergies  Allergen Reactions  . Pork-Derived Products     No pork products    Family History  Problem Relation Age of Onset  . Hypertension Mother     Prior to Admission medications   Medication Sig Start Date End Date Taking? Authorizing Provider  diphenhydramine-acetaminophen (TYLENOL PM) 25-500 MG TABS Take 1 tablet by mouth at bedtime as needed (for sleep).   Yes Historical Provider, MD  Melatonin 3 MG TABS Take 1 tablet by mouth at bedtime as needed (for sleep).   Yes Historical Provider, MD  nitroGLYCERIN (NITROSTAT) 0.4 MG SL tablet Place 0.4 mg under the tongue every 5 (five) minutes as needed for chest pain.   Yes Historical Provider, MD  albuterol (PROVENTIL HFA;VENTOLIN HFA) 108 (90 BASE) MCG/ACT inhaler Inhale 1-2 puffs into the lungs every 4 (four) hours as needed for wheezing or shortness of breath. Patient not taking: Reported on 10/28/2014 06/02/14   Marisa Severin, MD  ALPRAZolam Prudy Feeler) 0.5 MG tablet Take 1 tablet (0.5 mg total) by mouth at bedtime as needed for anxiety. Patient not taking: Reported on 10/28/2014 01/15/14   Felicie Morn, NP  azithromycin (ZITHROMAX) 250 MG tablet Take 1 tablet (250 mg total) by mouth daily. Take first 2 tablets together, then 1 every day until finished. Patient not taking: Reported on 10/28/2014 08/19/14   Marissa Sciacca, PA-C  benzonatate (TESSALON) 200 MG capsule Take 1 capsule (200 mg total) by mouth once.  Patient not taking: Reported on 10/28/2014 06/02/14   Marisa Severin, MD  guaiFENesin-dextromethorphan [facility] DM) 100-10 MG/5ML syrup Take 5 mLs by mouth every 4 (four) hours as needed for cough. Patient not taking: Reported on 10/28/2014 06/02/14   Marisa Severin, MD  ibuprofen (ADVIL,MOTRIN) 800 MG tablet Take 1 tablet (800 mg total) by mouth every 8 (eight) hours as needed for fever, headache or moderate pain. Patient not taking: Reported on 10/28/2014 06/02/14   Marisa Severin, MD  LORazepam  (ATIVAN) 1 MG tablet Take 1 tablet (1 mg total) by mouth 3 (three) times daily as needed for anxiety. Patient not taking: Reported on 10/28/2014 01/18/14   Marlon Pel, PA-C    Physical Exam: Filed Vitals:   10/28/14 0315 10/28/14 0330 10/28/14 0400 10/28/14 0445  BP: 128/73 129/72 143/81 110/69  Pulse: 84 81 89 82  Temp:      TempSrc:      Resp: 20 15 19 22   Height:      Weight:      SpO2: 100% 100% 100% 100%    General: Alert, Awake and Oriented to Time, Place and Person. Appear in mild distress Eyes: PERRL ENT: Oral Mucosa clear, moist. Neck: no JVD, no Carotid Bruits  Cardiovascular: S1 and S2 Present, no Murmur, Peripheral Pulses Present Respiratory: Bilateral Air entry equal and Decreased,  Clear to Auscultation,  no Crackles,no wheezes Abdomen: Bowel Sound Present, Soft and Non tender Skin: no Rash Extremities: no Pedal edema, no calf tenderness Neurologic: Grossly Unremarkable.  Labs:  CBC:  Recent Labs Lab 10/28/14 0015 10/28/14 0428  WBC 8.4  --   NEUTROABS 5.3  --   HGB 8.4* 8.1*  HCT 28.3* 27.8*  MCV 67.7*  --   PLT 617*  --    Basic Metabolic Panel:  Recent Labs Lab 10/28/14 0015  NA 137  K 3.6  CL 103  CO2 26  GLUCOSE 99  BUN 10  CREATININE 0.93  CALCIUM 8.7*   Liver Function Tests:  Recent Labs Lab 10/28/14 0015  AST 15  ALT 7*  ALKPHOS 76  BILITOT 0.2*  PROT 6.2*  ALBUMIN 3.3*   No results for input(s): LIPASE, AMYLASE in the last 168 hours. No results for input(s): AMMONIA in the last 168 hours.  Cardiac Enzymes:  Recent Labs Lab 10/28/14 0015 10/28/14 0428  TROPONINI <0.03 <0.03    BNP (last 3 results) No results for input(s): PROBNP in the last 8760 hours. CBG: No results for input(s): GLUCAP in the last 168 hours.  Radiological Exams: Dg Chest 2 View  10/28/2014   CLINICAL DATA:  Chest pain and weakness.  EXAM: CHEST  2 VIEW  COMPARISON:  08/19/2014  FINDINGS: The heart size and mediastinal contours are  within normal limits. Both lungs are clear. The visualized skeletal structures are unremarkable.  IMPRESSION: No active cardiopulmonary disease.   Electronically Signed   By: Ellery Plunk M.D.   On: 10/28/2014 01:11    EKG: Independently reviewed. normal sinus rhythm, nonspecific ST and T waves changes.  Assessment/Plan 1.palpitation. The patient is presenting with compressive palpitation. The patient does not appear to have any arrhythmia. In the ER nor appears to have any ED significant EKG changes. D-dimer is negative troponin 2 is negative patient's blood pressure is stable patient does not appear to be having any hypoxia as well. Smoking examination as well as cardiac examination is unremarkable. With this the patient probably appear to have significant anxiety and recommended to follow-up with his  PCP.  2.fatigue. Chronic anemia. The patient appears to have chronic anemia which appears to be iron deficiency based on the MCV. Patient's fatigue appears to be secondary to the same as well. Probable cause of her chronic anemia is menorrhagia. Recommendation to follow up with her PCP to establish care with OB/GYN for menorrhagia. Also recommended to patient to take iron supplements 3 times a day.  3.suspected chest pain. With 2 negative troponin and no complaints of chest pain with no EKG changes to suggest acute ischemia patient appears to be seen to be discharged home and to be followed up with the PCP. Strict return precautions should be provided to the patient.  Thank you very much for involving [place] in care of your patient.  We will sign off at present, please call us again as needed.  Author: Lynden Oxford, MD [facility] Pager: [phone] 10/28/2014 5:41 AM    If 7PM-7AM, please contact night-coverage [email or web address] Password [email or web address]

## 2014-10-28 NOTE — ED Notes (Signed)
Pt speaks arabic; pacific interpreter phone used

## 2014-10-28 NOTE — Discharge Instructions (Signed)
Anemia, Nonspecific Anemia is a condition in which the concentration of red blood cells or hemoglobin in the blood is below normal. Hemoglobin is a substance in red blood cells that carries oxygen to the tissues of the body. Anemia results in not enough oxygen reaching these tissues.  CAUSES  Common causes of anemia include:   Excessive bleeding. Bleeding may be internal or external. This includes excessive bleeding from periods (in women) or from the intestine.   Poor nutrition.   Chronic kidney, thyroid, and liver disease.  Bone marrow disorders that decrease red blood cell production.  Cancer and treatments for cancer.  HIV, AIDS, and their treatments.  Spleen problems that increase red blood cell destruction.  Blood disorders.  Excess destruction of red blood cells due to infection, medicines, and autoimmune disorders. SIGNS AND SYMPTOMS   Minor weakness.   Dizziness.   Headache.  Palpitations.   Shortness of breath, especially with exercise.   Paleness.  Cold sensitivity.  Indigestion.  Nausea.  Difficulty sleeping.  Difficulty concentrating. Symptoms may occur suddenly or they may develop slowly.  DIAGNOSIS  Additional blood tests are often needed. These help your health care provider determine the best treatment. Your health care provider will check your stool for blood and look for other causes of blood loss.  TREATMENT  Treatment varies depending on the cause of the anemia. Treatment can include:   Supplements of iron, vitamin B12, or folic acid.   Hormone medicines.   A blood transfusion. This may be needed if blood loss is severe.   Hospitalization. This may be needed if there is significant continual blood loss.   Dietary changes.  Spleen removal. HOME CARE INSTRUCTIONS Keep all follow-up appointments. It often takes many weeks to correct anemia, and having your health care provider check on your condition and your response to  treatment is very important. SEEK IMMEDIATE MEDICAL CARE IF:   You develop extreme weakness, shortness of breath, or chest pain.   You become dizzy or have trouble concentrating.  You develop heavy vaginal bleeding.   You develop a rash.   You have bloody or black, tarry stools.   You faint.   You vomit up blood.   You vomit repeatedly.   You have abdominal pain.  You have a fever or persistent symptoms for more than 2-3 days.   You have a fever and your symptoms suddenly get worse.   You are dehydrated.  MAKE SURE YOU:  Understand these instructions.  Will watch your condition.  Will get help right away if you are not doing well or get worse. Document Released: 04/19/2004 Document Revised: 11/12/2012 Document Reviewed: 09/05/2012 [facility] Patient Information 2015 [place], [place]. This information is not intended to replace advice given to you by your health care provider. Make sure you discuss any questions you have with your health care provider.  Palpitations A palpitation is the feeling that your heartbeat is irregular or is faster than normal. It may feel like your heart is fluttering or skipping a beat. Palpitations are usually not a serious problem. However, in some cases, you may need further medical evaluation. CAUSES  Palpitations can be caused by:  Smoking.  Caffeine or other stimulants, such as diet pills or energy drinks.  Alcohol.  Stress and anxiety.  Strenuous physical activity.  Fatigue.  Certain medicines.  Heart disease, especially if you have a history of irregular heart rhythms (arrhythmias), such as atrial fibrillation, atrial flutter, or supraventricular tachycardia.  An improperly working  pacemaker or defibrillator. DIAGNOSIS  To find the cause of your palpitations, your health care provider will take your medical history and perform a physical exam. Your health care provider may also have you take a test called an  ambulatory electrocardiogram (ECG). An ECG records your heartbeat patterns over a 24-hour period. You may also have other tests, such as:  Transthoracic echocardiogram (TTE). During echocardiography, sound waves are used to evaluate how blood flows through your heart.  Transesophageal echocardiogram (TEE).  Cardiac monitoring. This allows your health care provider to monitor your heart rate and rhythm in real time.  Holter monitor. This is a portable device that records your heartbeat and can help diagnose heart arrhythmias. It allows your health care provider to track your heart activity for several days, if needed.  Stress tests by exercise or by giving medicine that makes the heart beat faster. TREATMENT  Treatment of palpitations depends on the cause of your symptoms and can vary greatly. Most cases of palpitations do not require any treatment other than time, relaxation, and monitoring your symptoms. Other causes, such as atrial fibrillation, atrial flutter, or supraventricular tachycardia, usually require further treatment. HOME CARE INSTRUCTIONS   Avoid:  Caffeinated coffee, tea, soft drinks, diet pills, and energy drinks.  Chocolate.  Alcohol.  Stop smoking if you smoke.  Reduce your stress and anxiety. Things that can help you relax include:  A method of controlling things in your body, such as your heartbeats, with your mind (biofeedback).  Yoga.  Meditation.  Physical activity such as swimming, jogging, or walking.  Get plenty of rest and sleep. SEEK MEDICAL CARE IF:   You continue to have a fast or irregular heartbeat beyond 24 hours.  Your palpitations occur more often. SEEK IMMEDIATE MEDICAL CARE IF:  You have chest pain or shortness of breath.  You have a severe headache.  You feel dizzy or you faint. MAKE SURE YOU:  Understand these instructions.  Will watch your condition.  Will get help right away if you are not doing well or get  worse. Document Released: 03/09/2000 Document Revised: 03/17/2013 Document Reviewed: 05/11/2011 [facility] Patient Information 2015 [place], [place]. This information is not intended to replace advice given to you by your health care provider. Make sure you discuss any questions you have with your health care provider.

## 2015-03-08 ENCOUNTER — Ambulatory Visit (INDEPENDENT_AMBULATORY_CARE_PROVIDER_SITE_OTHER): Payer: BLUE CROSS/BLUE SHIELD | Admitting: Physician Assistant

## 2015-03-08 ENCOUNTER — Encounter: Payer: Self-pay | Admitting: Physician Assistant

## 2015-03-08 VITALS — BP 105/82 | HR 85 | Temp 98.4°F | Resp 16 | Ht 66.0 in | Wt 232.8 lb

## 2015-03-08 DIAGNOSIS — Z0000 Encounter for general adult medical examination without abnormal findings: Secondary | ICD-10-CM | POA: Diagnosis not present

## 2015-03-08 DIAGNOSIS — G8929 Other chronic pain: Secondary | ICD-10-CM

## 2015-03-08 DIAGNOSIS — Z01419 Encounter for gynecological examination (general) (routine) without abnormal findings: Secondary | ICD-10-CM | POA: Diagnosis not present

## 2015-03-08 DIAGNOSIS — Z1239 Encounter for other screening for malignant neoplasm of breast: Secondary | ICD-10-CM | POA: Diagnosis not present

## 2015-03-08 DIAGNOSIS — Z418 Encounter for other procedures for purposes other than remedying health state: Secondary | ICD-10-CM

## 2015-03-08 DIAGNOSIS — Z299 Encounter for prophylactic measures, unspecified: Secondary | ICD-10-CM

## 2015-03-08 DIAGNOSIS — Z139 Encounter for screening, unspecified: Secondary | ICD-10-CM | POA: Diagnosis not present

## 2015-03-08 DIAGNOSIS — Z23 Encounter for immunization: Secondary | ICD-10-CM

## 2015-03-08 DIAGNOSIS — K6289 Other specified diseases of anus and rectum: Secondary | ICD-10-CM

## 2015-03-08 LAB — COMPLETE METABOLIC PANEL WITH GFR
ALT: 5 U/L — ABNORMAL LOW (ref 6–29)
AST: 10 U/L (ref 10–35)
Albumin: 3.3 g/dL — ABNORMAL LOW (ref 3.6–5.1)
Alkaline Phosphatase: 71 U/L (ref 33–115)
BUN: 11 mg/dL (ref 7–25)
CALCIUM: 8.6 mg/dL (ref 8.6–10.2)
CHLORIDE: 110 mmol/L (ref 98–110)
CO2: 26 mmol/L (ref 20–31)
Creat: 0.65 mg/dL (ref 0.50–1.10)
GFR, Est African American: >89 mL/min (ref >=60)
Glucose, Bld: 79 mg/dL (ref 65–99)
POTASSIUM: 4.5 mmol/L (ref 3.5–5.3)
Sodium: 143 mmol/L (ref 135–146)
Total Bilirubin: 0.2 mg/dL (ref 0.2–1.2)
Total Protein: 5.8 g/dL — ABNORMAL LOW (ref 6.1–8.1)

## 2015-03-08 LAB — LIPID PANEL
CHOLESTEROL: 139 mg/dL (ref 125–200)
HDL: 32 mg/dL — AB (ref >=46)
LDL CALC: 92 mg/dL (ref <130)
Total CHOL/HDL Ratio: 4.3 Ratio (ref <=5.0)
Triglycerides: 75 mg/dL (ref <150)
VLDL: 15 mg/dL (ref <30)

## 2015-03-08 LAB — TSH: TSH: 1.993 u[IU]/mL (ref 0.350–4.500)

## 2015-03-08 LAB — CBC
HEMATOCRIT: 28.2 % — AB (ref 36.0–46.0)
Hemoglobin: 8.2 g/dL — ABNORMAL LOW (ref 12.0–15.0)
MCH: 18.8 pg — ABNORMAL LOW (ref 26.0–34.0)
MCHC: 29.1 g/dL — ABNORMAL LOW (ref 30.0–36.0)
MCV: 64.7 fL — ABNORMAL LOW (ref 78.0–100.0)
MPV: 8.7 fL (ref 8.6–12.4)
Platelets: 627 10*3/uL — ABNORMAL HIGH (ref 150–400)
RBC: 4.36 MIL/uL (ref 3.87–5.11)
RDW: 18.9 % — ABNORMAL HIGH (ref 11.5–15.5)
WBC: 5.3 10*3/uL (ref 4.0–10.5)

## 2015-03-08 MED ORDER — POLYETHYLENE GLYCOL 3350 17 GM/SCOOP PO POWD: 17.0000 g | Freq: Two times a day (BID) | ORAL | Status: DC | PRN

## 2015-03-08 MED ORDER — PSYLLIUM 58.6 % PO POWD: 1.0000 | Freq: Every day | ORAL | Status: DC

## 2015-03-08 MED ORDER — HYDROCORTISONE ACETATE 25 MG RE SUPP: 25.0000 mg | Freq: Two times a day (BID) | RECTAL | Status: DC

## 2015-03-08 NOTE — Progress Notes (Signed)
03/13/2015 9:57 AM   DOB: 03/06/1966 / MRN: [id number]  SUBJECTIVE:  HPI  She has never had a colonoscopy.  She would like a flu shot today.  She declines the TDAP.  She declines the pap smear.  She is a never smoker. She denies anhedonia and depression. She denies a personal history of diabetes and HTN however does have a family history.   She reports hemorrhoids and reports that she "has a lot of things outside" with reference to her anus. She is not willing to   She has history of constipation and typically passes stool 1 time per week.  She also complains of pain with sitting which is made worse after defecation. Also complains of bright red blood on the paper. She has had this problem for ten years and wants to see a Careers advisersurgeon.     Depression screen PHQ 2/9 03/08/2015  Decreased Interest 0  Down, Depressed, Hopeless 0  PHQ - 2 Score 0    She is allergic to pork-derived products.   She  has a past medical history of Migraine; Hypertension; and Anxiety.    She  reports that she has never smoked. She does not have any smokeless tobacco history on file. She reports that she does not drink alcohol or use illicit drugs. She  has no sexual activity history on file. The patient  has past surgical history that includes Cesarean section.  Her family history includes Hypertension in her mother.  Immunization History  Administered Date(s) Administered  . Influenza,inj,Quad PF,36+ Mos 03/08/2015    Review of Systems  Constitutional: Negative for fever and chills.  Eyes: Negative for blurred vision.  Respiratory: Negative for cough and shortness of breath.   Cardiovascular: Negative for chest pain.  Gastrointestinal: Positive for blood in stool (BRBPR). Negative for nausea and abdominal pain.  Genitourinary: Negative for dysuria, urgency and frequency.  Musculoskeletal: Negative for myalgias.  Skin: Negative for rash.  Neurological: Negative for dizziness, tingling and headaches.    Psychiatric/Behavioral: Negative for depression. The patient is not nervous/anxious.     Problem list and medications reviewed and updated by myself where necessary, and exist elsewhere in the encounter.   OBJECTIVE:  BP 105/82 mmHg  Pulse 85  Temp(Src) 98.4 F (36.9 C) (Oral)  Resp 16  Ht 5\' 6"  (1.676 m)  Wt 232 lb 12.8 oz (105.597 kg)  BMI 37.59 kg/m2  SpO2 100%  LMP 03/05/2015 Estimated Creatinine Clearance: 106.8 mL/min (by C-G formula based on Cr of 0.65).  Physical Exam  Constitutional: She is oriented to person, place, and time. She appears well-nourished. No distress.  Eyes: EOM are normal. Pupils are equal, round, and reactive to light.  Cardiovascular: Normal rate.   Pulmonary/Chest: Effort normal.  Abdominal: Soft. Bowel sounds are normal. She exhibits no distension and no mass. There is no tenderness. There is no rebound and no guarding.  Genitourinary:  Patient would not allow me to examine her.    Neurological: She is alert and oriented to person, place, and time. No cranial nerve deficit. Gait normal.  Skin: Skin is dry. She is not diaphoretic.  Psychiatric: She has a normal mood and affect.  Vitals reviewed.   No results found for this or any previous visit (from the past 48 hour(s)).  ASSESSMENT AND PLAN  Samantha Logan was seen today for medication refill and establish care.  Diagnoses and all orders for this visit:  Annual physical exam  Screening -     COMPLETE METABOLIC  PANEL WITH GFR -     CBC -     Lipid panel -     TSH -     Hemoglobin A1c -     HIV antibody -     RPR -     Hepatitis C antibody -     MM DIGITAL SCREENING BILATERAL; Future -     Cancel: Pap IG w/ reflex to HPV when ASC-U  Need for prophylactic measure -     Flu Vaccine QUAD 36+ mos IM -     Tdap vaccine greater than or equal to 7yo IM  Rectal pain, chronic: Patient would not allow me to examine her anus.  Advised that there may be something else causing her problems and she  only wants to see a Careers adviser.   -     psyllium (METAMUCIL SMOOTH TEXTURE) 58.6 % powder; Take 1 packet by mouth daily. -     polyethylene glycol powder (GLYCOLAX/MIRALAX) powder; Take 17 g by mouth 2 (two) times daily as needed. -     hydrocortisone (ANUSOL-HC) 25 MG suppository; Place 1 suppository (25 mg total) rectally 2 (two) times daily. -     Ambulatory referral to General Surgery  Breast cancer screening: Managed with problem 2.     The patient was advised to call or return to clinic if she does not see an improvement in symptoms or to seek the care of the closest emergency department if she worsens with the above plan.   Deliah Boston, MHS, PA-C [facility] 03/13/2015 9:57 AM

## 2015-03-09 ENCOUNTER — Other Ambulatory Visit: Payer: Self-pay | Admitting: Physician Assistant

## 2015-03-09 DIAGNOSIS — D508 Other iron deficiency anemias: Secondary | ICD-10-CM

## 2015-03-09 DIAGNOSIS — R718 Other abnormality of red blood cells: Secondary | ICD-10-CM

## 2015-03-09 LAB — HIV ANTIBODY (ROUTINE TESTING W REFLEX): HIV: NONREACTIVE

## 2015-03-09 LAB — HEPATITIS C ANTIBODY: HCV Ab: NEGATIVE

## 2015-03-09 LAB — HEMOGLOBIN A1C
Hgb A1c MFr Bld: 5.5 % (ref <5.7)
Mean Plasma Glucose: 111 mg/dL (ref <117)

## 2015-03-09 LAB — RPR

## 2015-03-09 MED ORDER — FERROUS SULFATE 325 (65 FE) MG PO TBEC: 325.0000 mg | DELAYED_RELEASE_TABLET | Freq: Three times a day (TID) | ORAL | Status: DC

## 2015-03-14 ENCOUNTER — Other Ambulatory Visit: Payer: Self-pay

## 2015-03-14 DIAGNOSIS — Z1231 Encounter for screening mammogram for malignant neoplasm of breast: Secondary | ICD-10-CM

## 2015-04-14 ENCOUNTER — Ambulatory Visit: Payer: Self-pay | Admitting: Surgery

## 2015-04-27 ENCOUNTER — Ambulatory Visit
Admission: RE | Admit: 2015-04-27 | Discharge: 2015-04-27 | Disposition: A | Discharge status: Home / Self Care | Payer: BLUE CROSS/BLUE SHIELD | Source: Ambulatory Visit | Attending: Physician Assistant | Admitting: Physician Assistant

## 2015-04-27 DIAGNOSIS — Z1231 Encounter for screening mammogram for malignant neoplasm of breast: Secondary | ICD-10-CM

## 2015-04-29 ENCOUNTER — Other Ambulatory Visit: Payer: Self-pay | Admitting: Physician Assistant

## 2015-04-29 DIAGNOSIS — R928 Other abnormal and inconclusive findings on diagnostic imaging of breast: Secondary | ICD-10-CM

## 2015-05-02 ENCOUNTER — Other Ambulatory Visit: Payer: Self-pay | Admitting: Physician Assistant

## 2015-05-02 ENCOUNTER — Ambulatory Visit
Admission: RE | Admit: 2015-05-02 | Discharge: 2015-05-02 | Disposition: A | Discharge status: Home / Self Care | Payer: BLUE CROSS/BLUE SHIELD | Source: Ambulatory Visit | Attending: Physician Assistant | Admitting: Physician Assistant

## 2015-05-02 DIAGNOSIS — N6310 Unspecified lump in the right breast, unspecified quadrant: Secondary | ICD-10-CM

## 2015-05-02 DIAGNOSIS — R928 Other abnormal and inconclusive findings on diagnostic imaging of breast: Secondary | ICD-10-CM

## 2015-05-06 ENCOUNTER — Other Ambulatory Visit: Payer: Self-pay | Admitting: Physician Assistant

## 2015-05-06 ENCOUNTER — Inpatient Hospital Stay: Admission: RE | Admit: 2015-05-06 | Payer: BLUE CROSS/BLUE SHIELD | Source: Ambulatory Visit

## 2015-05-06 DIAGNOSIS — N6310 Unspecified lump in the right breast, unspecified quadrant: Secondary | ICD-10-CM

## 2015-05-12 ENCOUNTER — Inpatient Hospital Stay: Admission: RE | Admit: 2015-05-12 | Payer: BLUE CROSS/BLUE SHIELD | Source: Ambulatory Visit

## 2016-02-09 ENCOUNTER — Emergency Department (HOSPITAL_COMMUNITY): Payer: BLUE CROSS/BLUE SHIELD

## 2016-02-09 ENCOUNTER — Encounter (HOSPITAL_COMMUNITY): Payer: Self-pay | Admitting: Emergency Medicine

## 2016-02-09 ENCOUNTER — Emergency Department (HOSPITAL_COMMUNITY)
Admission: EM | Admit: 2016-02-09 | Discharge: 2016-02-09 | Disposition: A | Discharge status: Home / Self Care | Payer: BLUE CROSS/BLUE SHIELD | Attending: Emergency Medicine | Admitting: Emergency Medicine

## 2016-02-09 DIAGNOSIS — J069 Acute upper respiratory infection, unspecified: Secondary | ICD-10-CM | POA: Insufficient documentation

## 2016-02-09 DIAGNOSIS — I10 Essential (primary) hypertension: Secondary | ICD-10-CM | POA: Diagnosis not present

## 2016-02-09 DIAGNOSIS — B9789 Other viral agents as the cause of diseases classified elsewhere: Secondary | ICD-10-CM

## 2016-02-09 DIAGNOSIS — R05 Cough: Secondary | ICD-10-CM | POA: Diagnosis present

## 2016-02-09 MED ORDER — BENZONATATE 200 MG PO CAPS: 200.0000 mg | ORAL_CAPSULE | Freq: Three times a day (TID) | ORAL | 0 refills | Status: DC | PRN

## 2016-02-09 MED ORDER — PHENYLEPHRINE-APAP-GUAIFENESIN 5-325-200 MG PO TABS: 2.0000 | ORAL_TABLET | Freq: Four times a day (QID) | ORAL | 0 refills | Status: DC | PRN

## 2016-02-09 NOTE — ED Notes (Signed)
Patient did not respond when called back to a room

## 2016-02-09 NOTE — ED Notes (Signed)
Radiology called and notified patient was in the wrong waiting area. Pt redirected back to main waiting room. PT will be next to go back.

## 2016-02-09 NOTE — ED Notes (Signed)
Attempted to locate patient a third time, unable to find patient. Bathrooms checked.

## 2016-02-09 NOTE — ED Triage Notes (Signed)
Pt states shes had a runny nose for a month, difficulty sleeping, pt also c/o headache and cough starting today. Pt states when she wakes up in the morning shes coughed up yellow stuff. Pain 8/10 in head.

## 2016-02-09 NOTE — ED Provider Notes (Signed)
[facility]PT Provider Note   CSN: [id number] Arrival date & time: 02/09/16  1438     History   Chief Complaint Chief Complaint  Patient presents with  . Cough  . Headache    HPI Samantha Logan is a 50 y.o. female.  The history is provided by the patient.  Cough  This is a new problem. The current episode started 12 to 24 hours ago. The problem occurs constantly. The problem has been gradually worsening. The cough is non-productive. There has been no fever. Associated symptoms include headaches. Treatments tried: benadryl. The treatment provided mild relief. She is not a smoker. Her past medical history does not include COPD or asthma.  Headache   This is a new problem. The current episode started 6 to 12 hours ago. The problem occurs constantly. The problem has not changed since onset.Associated with: coughing.    Past Medical History:  Diagnosis Date  . Anxiety   . Hypertension   . Migraine     There are no active problems to display for this patient.   Past Surgical History:  Procedure Laterality Date  . CESAREAN SECTION      OB History    No data available       Home Medications    Prior to Admission medications   Medication Sig Start Date End Date Taking? Authorizing Provider  albuterol (PROVENTIL HFA;VENTOLIN HFA) 108 (90 BASE) MCG/ACT inhaler Inhale 1-2 puffs into the lungs every 4 (four) hours as needed for wheezing or shortness of breath. 06/02/14   Marisa Severinlga Otter, MD  diphenhydramine-acetaminophen (TYLENOL PM) 25-500 MG TABS Take 1 tablet by mouth at bedtime as needed (for sleep).    Historical Provider, MD  ferrous sulfate 325 (65 FE) MG EC tablet Take 1 tablet (325 mg total) by mouth 3 (three) times daily with meals. 03/09/15   Ofilia NeasMichael L Clark, PA-C  hydrocortisone (ANUSOL-HC) 25 MG suppository Place 1 suppository (25 mg total) rectally 2 (two) times daily. 03/08/15   Ofilia NeasMichael L Clark, PA-C  Melatonin 3 MG TABS Take 1 tablet by mouth at bedtime as  needed (for sleep).    Historical Provider, MD  nitroGLYCERIN (NITROSTAT) 0.4 MG SL tablet Place 0.4 mg under the tongue every 5 (five) minutes as needed for chest pain.    Historical Provider, MD  polyethylene glycol powder (GLYCOLAX/MIRALAX) powder Take 17 g by mouth 2 (two) times daily as needed. 03/08/15   Ofilia NeasMichael L Clark, PA-C  psyllium (METAMUCIL SMOOTH TEXTURE) 58.6 % powder Take 1 packet by mouth daily. 03/08/15   Ofilia NeasMichael L Clark, PA-C    Family History Family History  Problem Relation Age of Onset  . Hypertension Mother     Social History Social History  Substance Use Topics  . Smoking status: Never Smoker  . Smokeless tobacco: Not on file  . Alcohol use No     Allergies   Pork-derived products   Review of Systems Review of Systems  Respiratory: Positive for cough.   Neurological: Positive for headaches.  All other systems reviewed and are negative.    Physical Exam Updated Vital Signs BP 121/90   Pulse 80   Temp 97.9 F (36.6 C) (Oral)   Resp 20   LMP 02/08/2016   SpO2 100%   Physical Exam  Constitutional: She is oriented to person, place, and time. She appears well-developed and well-nourished. No distress.  HENT:  Head: Normocephalic.  Mouth/Throat: No oropharyngeal exudate.  Nasal congestion  Eyes: Conjunctivae are normal.  Neck: Neck supple. No tracheal deviation present.  Cardiovascular: Normal rate, regular rhythm and normal heart sounds.   Pulmonary/Chest: Effort normal and breath sounds normal. No respiratory distress.  Abdominal: Soft. She exhibits no distension.  Neurological: She is alert and oriented to person, place, and time.  Skin: Skin is warm and dry.  Psychiatric: She has a normal mood and affect.  Vitals reviewed.    ED Treatments / Results  Labs (all labs ordered are listed, but only abnormal results are displayed) Labs Reviewed - No data to display  EKG  EKG Interpretation None       Radiology Dg Chest 2  View  Result Date: 02/09/2016 CLINICAL DATA:  Follow-up pneumonia. Productive cough, chest tightness. History of hypertension. EXAM: CHEST  2 VIEW COMPARISON:  Chest radiograph October 28, 2014 FINDINGS: Cardiomediastinal silhouette is normal. No pleural effusions or focal consolidations. Trachea projects midline and there is no pneumothorax. Soft tissue planes and included osseous structures are non-suspicious. IMPRESSION: Normal chest radiograph. Electronically Signed   By: Awilda Metroourtnay  Bloomer M.D.   On: 02/09/2016 15:30    Procedures Procedures (including critical care time)  Medications Ordered in ED Medications - No data to display   Initial Impression / Assessment and Plan / ED Course  I have reviewed the triage vital signs and the nursing notes.  Pertinent labs & imaging results that were available during my care of the patient were reviewed by me and considered in my medical decision making (see chart for details).  Clinical Course     50 y.o. female presents with URI Sx over the last few days with nasal congestion prior to onset. No signs of pneumonia on CXR, no vital sign abnormalities. Suspect viral URI with normal ling sounds and well appearance. Provided oral decongestant, expectorant, and cough suppression to help with symptoms. Plan to follow up with PCP as needed and return precautions discussed for worsening or new concerning symptoms.   Final Clinical Impressions(s) / ED Diagnoses   Final diagnoses:  Viral URI with cough    New Prescriptions Discharge Medication List as of 02/09/2016  5:31 PM    START taking these medications   Details  benzonatate (TESSALON) 200 MG capsule Take 1 capsule (200 mg total) by mouth 3 (three) times daily as needed for cough., Starting Thu 02/09/2016, Print    Phenylephrine-APAP-Guaifenesin 5-325-200 MG TABS Take 2 tablets by mouth every 6 (six) hours as needed (cough and cod symptoms)., Starting Thu 02/09/2016, Print         Lyndal Pulleyaniel  Sherine Cortese, MD 02/09/16 2245

## 2016-02-09 NOTE — ED Notes (Signed)
Called Pt. No response. 

## 2016-02-09 NOTE — ED Notes (Signed)
Attempted to locate patient in waiting room. No answer.

## 2016-02-09 NOTE — ED Notes (Signed)
Called xray to see if patient was back there, xray states she was not.

## 2016-02-13 ENCOUNTER — Ambulatory Visit (INDEPENDENT_AMBULATORY_CARE_PROVIDER_SITE_OTHER): Payer: BLUE CROSS/BLUE SHIELD | Admitting: Physician Assistant

## 2016-02-13 VITALS — BP 130/80 | HR 83 | Temp 97.1°F | Resp 17 | Ht 66.5 in | Wt 233.0 lb

## 2016-02-13 DIAGNOSIS — D649 Anemia, unspecified: Secondary | ICD-10-CM

## 2016-02-13 LAB — POCT CBC
Granulocyte percent: 58.3 %G (ref 37–80)
HCT, POC: 29.4 % — AB (ref 37.7–47.9)
HEMOGLOBIN: 9.6 g/dL — AB (ref 12.2–16.2)
LYMPH, POC: 2.1 (ref 0.6–3.4)
MCH, POC: 21.6 pg — AB (ref 27–31.2)
MCHC: 32.7 g/dL (ref 31.8–35.4)
MCV: 66.2 fL — AB (ref 80–97)
MID (cbc): 0.4 (ref 0–0.9)
MPV: 6.8 fL (ref 0–99.8)
PLATELET COUNT, POC: 596 10*3/uL — AB (ref 142–424)
POC Granulocyte: 3.4 (ref 2–6.9)
POC LYMPH %: 35.7 % (ref 10–50)
POC MID %: 6 %M (ref 0–12)
RBC: 4.44 M/uL (ref 4.04–5.48)
RDW, POC: 19.8 %
WBC: 5.9 10*3/uL (ref 4.6–10.2)

## 2016-02-13 NOTE — Progress Notes (Signed)
02/13/2016 5:16 PM   DOB: 11/18/1965 / MRN: [id number]  SUBJECTIVE:  Samantha Logan is a 50 y.o. female presenting for consult regarding her historically low H&H.  Chart reveals she has been measuring at a hmeoglobin of eight for quite some time now. She has been advised to take Iron and reports she is unable to tolerate this due to GI side effects.  She denies any heavy bleeding lately, reports her periods are becoming more and more sparse as well as lighter.  She denies any frank blood from the rectum at this time. She denies dizziness, nausea, chest pain. I did a phsyical on her last year and all other systems check out.     She has been historically difficult to examine due to religious beliefs.  I have seen her in the past for hemorrhoids and she refused an exam at that time. I sent her to GI and she did not present.   She is allergic to pork-derived products.   She  has a past medical history of Anxiety; Hypertension; and Migraine.    She  reports that she has never smoked. She does not have any smokeless tobacco history on file. She reports that she does not drink alcohol or use drugs. She  has no sexual activity history on file. The patient  has a past surgical history that includes Cesarean section.  Her family history includes Hypertension in her mother.  Review of Systems  Constitutional: Negative for fever and weight loss.  Respiratory: Negative for cough.   Gastrointestinal: Negative for nausea.  Musculoskeletal: Negative for myalgias.  Skin: Negative for rash.  Neurological: Negative for dizziness.    The problem list and medications were reviewed and updated by myself where necessary and exist elsewhere in the encounter.   OBJECTIVE:  BP 130/80 (BP Location: Right Arm, Patient Position: Sitting, Cuff Size: Large)   Pulse 83   Temp 97.1 F (36.2 C) (Oral)   Resp 17   Ht 5' 6.5" (1.689 m)   Wt 233 lb (105.7 kg)   LMP 02/08/2016   SpO2 98%   BMI 37.04 kg/m    Physical Exam  Constitutional: She is oriented to person, place, and time.  HENT:  Right Ear: External ear normal.  Left Ear: External ear normal.  Nose: Mucosal edema present. Right sinus exhibits no maxillary sinus tenderness and no frontal sinus tenderness. Left sinus exhibits no maxillary sinus tenderness and no frontal sinus tenderness.  Mouth/Throat: Oropharynx is clear and moist. No oropharyngeal exudate.  Eyes: Conjunctivae are normal. Pupils are equal, round, and reactive to light.  Cardiovascular: Regular rhythm and normal heart sounds.   Pulmonary/Chest: Effort normal and breath sounds normal.  Neurological: She is alert and oriented to person, place, and time.  Skin: Skin is warm and dry. No rash noted. She is not diaphoretic. No erythema. No pallor.  Psychiatric: Her behavior is normal.    Wt Readings from Last 3 Encounters:  02/13/16 233 lb (105.7 kg)  03/08/15 232 lb 12.8 oz (105.6 kg)  10/27/14 229 lb 1 oz (103.9 kg)   Lab Results  Component Value Date   WBC 5.3 03/08/2015   HGB 8.2 (L) 03/08/2015   HCT 28.2 (L) 03/08/2015   MCV 64.7 (L) 03/08/2015   PLT 627 (H) 03/08/2015    No results found for this or any previous visit (from the past 72 hour(s)).  No results found.  ASSESSMENT AND PLAN  Samantha Logan was seen today for anemia.  Diagnoses and all orders for this visit:  Anemia, unspecified type: I suppose there could be a thalassemia at play here and I will screen for that.  She is failing iron therapy.   -     Erythropoietin -     Pathologist smear review -     POCT CBC -     Iron panel    The patient is advised to call or return to clinic if she does not see an improvement in symptoms, or to seek the care of the closest emergency department if she worsens with the above plan.   Samantha Logan, MHS, PA-C [facility] 02/13/2016 5:16 PM

## 2016-02-13 NOTE — Patient Instructions (Signed)
     IF you received an x-ray today, you will receive an invoice from [facility]. Please contact [facility] at [phone] with questions or concerns regarding your invoice.   IF you received labwork today, you will receive an invoice from [facility]. Please contact [facility] at [phone] with questions or concerns regarding your invoice.   Our billing staff will not be able to assist you with questions regarding bills from these companies.  You will be contacted with the lab results as soon as they are available. The fastest way to get your results is to activate your My Chart account. Instructions are located on the last page of this paperwork. If you have not heard from us regarding the results in 2 weeks, please contact this office.      

## 2016-02-14 LAB — PATHOLOGIST SMEAR REVIEW

## 2016-02-15 ENCOUNTER — Other Ambulatory Visit: Payer: Self-pay | Admitting: Physician Assistant

## 2016-02-15 DIAGNOSIS — D509 Iron deficiency anemia, unspecified: Secondary | ICD-10-CM

## 2016-02-15 LAB — IRON: Iron: 26 ug/dL — ABNORMAL LOW (ref 40–190)

## 2016-02-15 LAB — IBC PANEL
%SAT: 8 % — AB (ref 11–50)
TIBC: 330 ug/dL (ref 250–450)
UIBC: 304 ug/dL (ref 125–400)

## 2016-02-15 LAB — ERYTHROPOIETIN: Erythropoietin: 41.8 m[IU]/mL — ABNORMAL HIGH (ref 2.6–18.5)

## 2016-02-15 NOTE — Progress Notes (Signed)
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 Samantha BostonMichael Shelaine Frie, MS, PA-C 1:14 PM, 02/15/2016

## 2016-03-05 IMAGING — CR DG CHEST 2V
2 series · 2 of 2 positions shown · non-contrast
Comparison: 12/24/2013

CLINICAL DATA: Increasing anxiety starting 1 week ago. Headache,
chest pain, and cough.

EXAM:
CHEST  2 VIEW

[w chest pa]
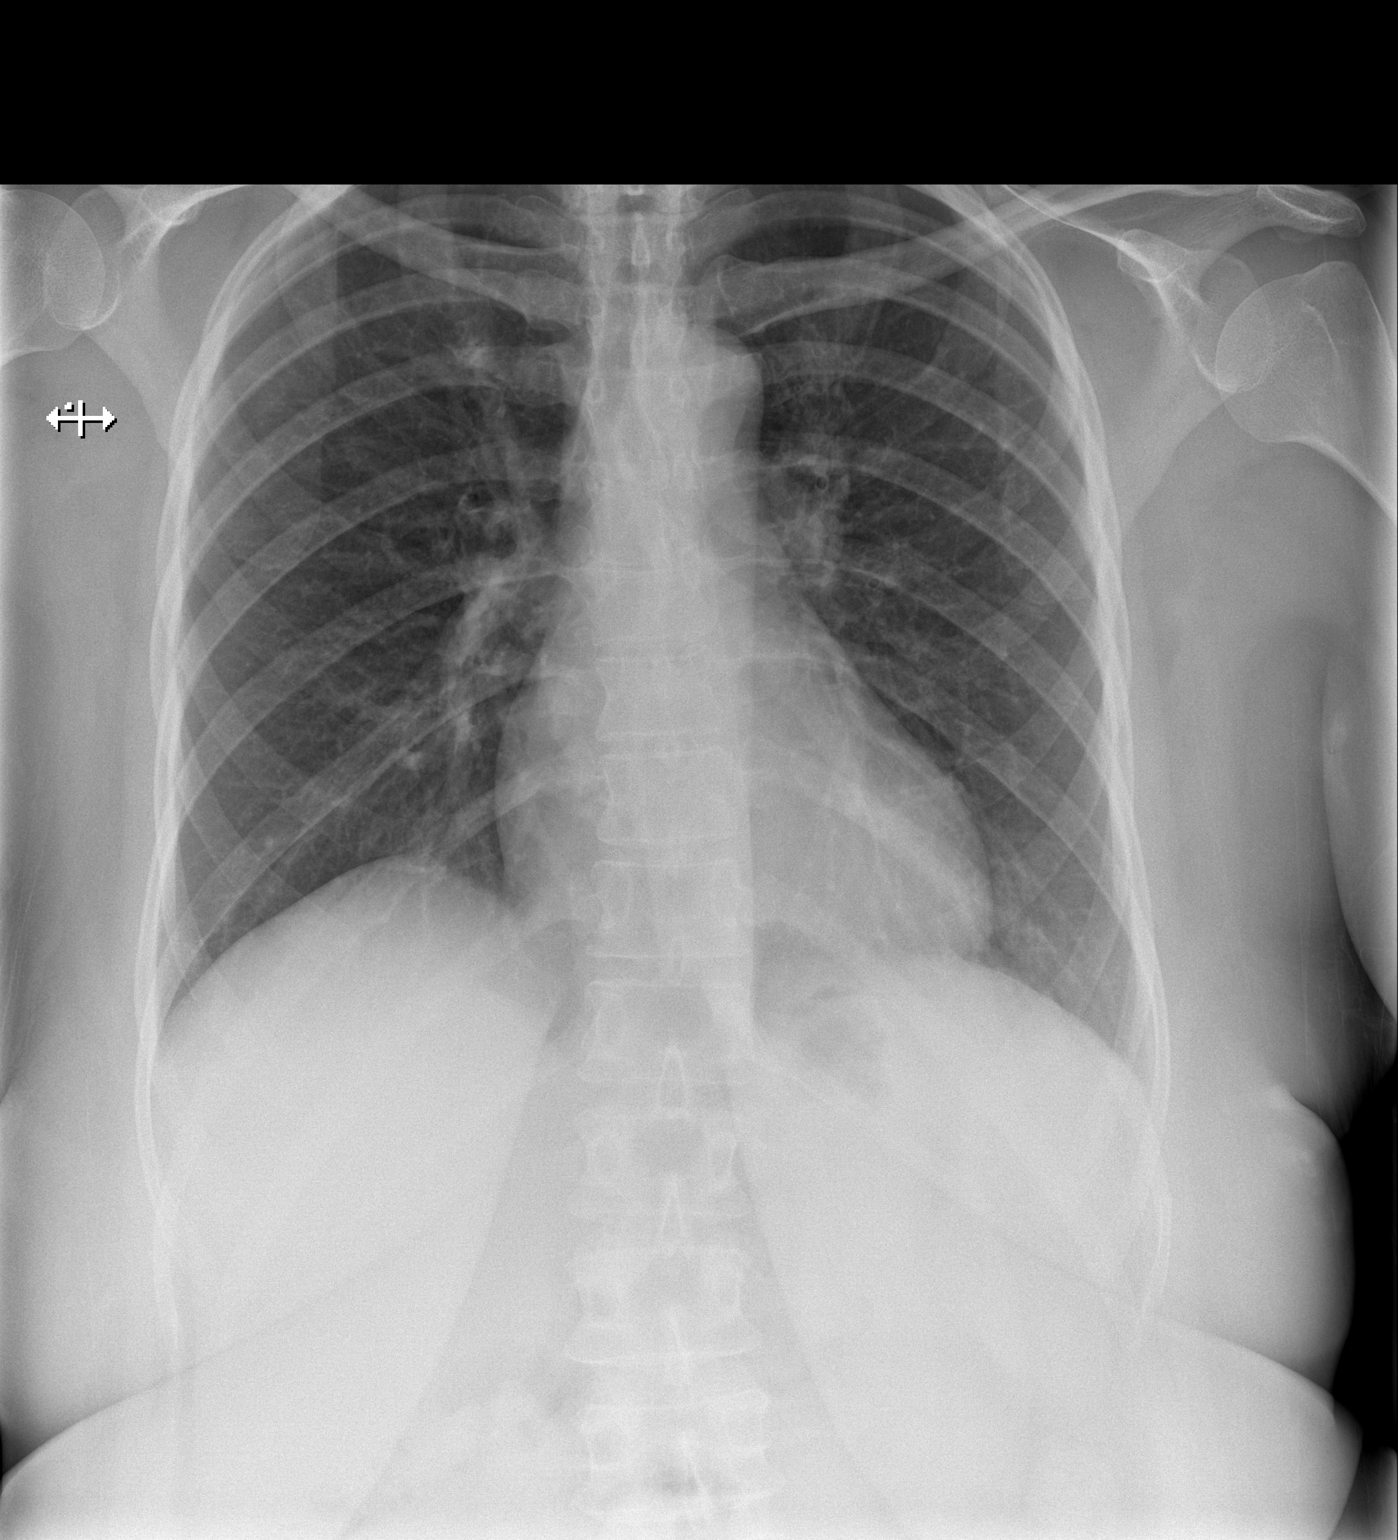

[w chest lat]
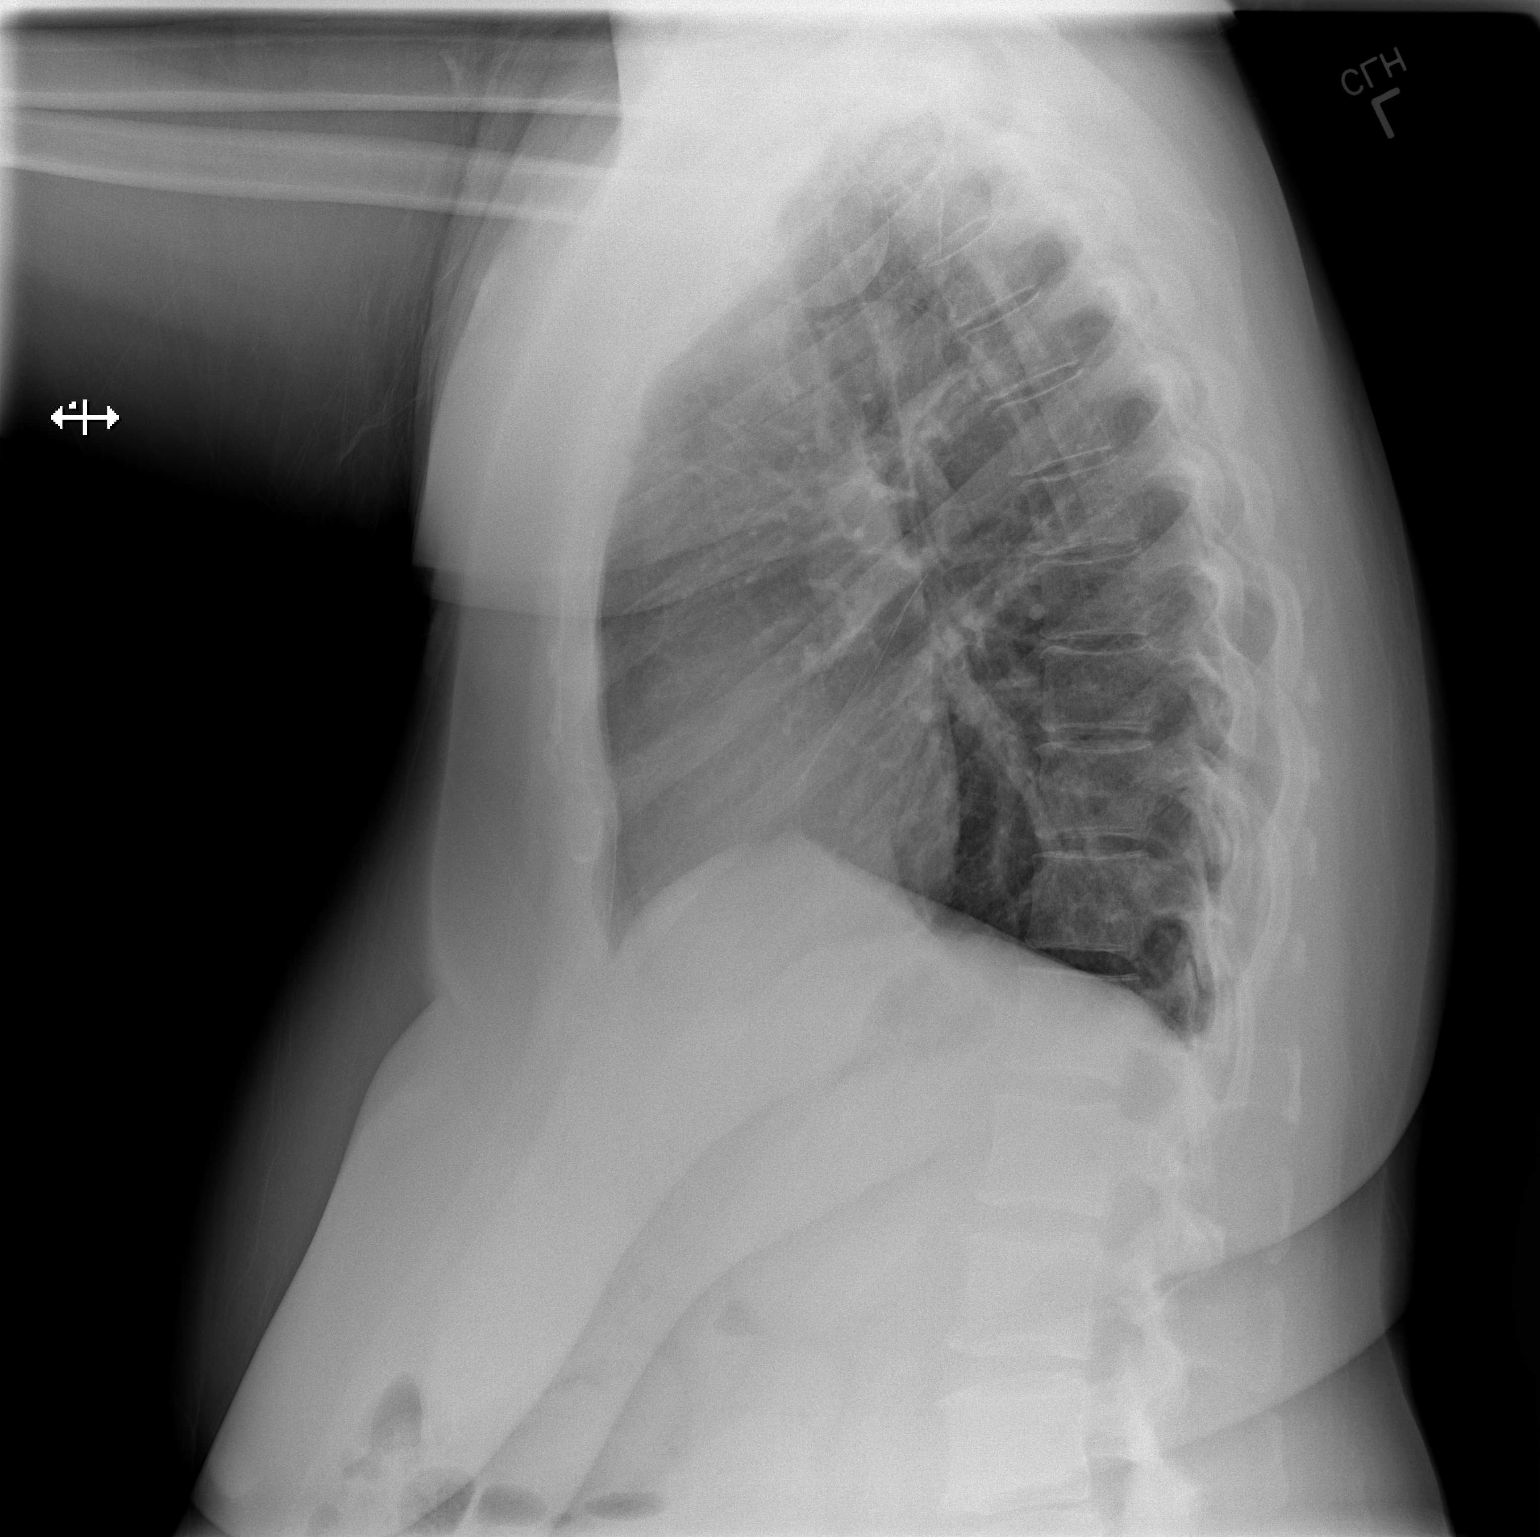

[2 of 2 positions shown; findings below may reference images not displayed]

FINDINGS: The heart size and mediastinal contours are within normal limits.
Both lungs are clear. The visualized skeletal structures are
unremarkable.
IMPRESSION: No active cardiopulmonary disease.

## 2016-07-10 ENCOUNTER — Encounter (HOSPITAL_COMMUNITY): Payer: Self-pay

## 2016-07-10 ENCOUNTER — Emergency Department (HOSPITAL_COMMUNITY)
Admission: EM | Admit: 2016-07-10 | Discharge: 2016-07-10 | Disposition: A | Discharge status: Home / Self Care | Payer: BLUE CROSS/BLUE SHIELD | Attending: Emergency Medicine | Admitting: Emergency Medicine

## 2016-07-10 DIAGNOSIS — I10 Essential (primary) hypertension: Secondary | ICD-10-CM | POA: Diagnosis not present

## 2016-07-10 DIAGNOSIS — L0291 Cutaneous abscess, unspecified: Secondary | ICD-10-CM

## 2016-07-10 DIAGNOSIS — L02212 Cutaneous abscess of back [any part, except buttock]: Secondary | ICD-10-CM | POA: Diagnosis present

## 2016-07-10 MED ORDER — LIDOCAINE HCL (PF) 1 % IJ SOLN
5.0000 mL | Freq: Once | INTRAMUSCULAR | Status: AC
  Administered 2016-07-10: 5 mL
  Filled 2016-07-10: qty 5

## 2016-07-10 NOTE — ED Notes (Signed)
Gave pt apple juice, per [facility] - RN.

## 2016-07-10 NOTE — ED Triage Notes (Signed)
Per Pt, Pt is coming from home with complaint of abscess noted to her upper left back. Reports hx fo the same. Denies fever or Headache.

## 2016-07-10 NOTE — ED Notes (Signed)
Pt asked for a room with a bed, instead of the chair. Pt repositioned on the chair.

## 2016-07-10 NOTE — ED Notes (Signed)
Pt's wound covered with a 4X4 and tape was used to secure it in place.

## 2016-07-10 NOTE — ED Provider Notes (Signed)
[facility] Provider Note   CSN: [id number] Arrival date & time: 07/10/16  1812  By signing my name below, I, Linna Darner, attest that this documentation has been prepared under the direction and in the presence of [facility] M. Damian Leavell, NP. Electronically Signed: Linna Darner, Scribe. 07/10/2016. 7:45 PM.  History   Chief Complaint Chief Complaint  Patient presents with  . Abscess    The history is provided by the patient. No language interpreter was used.  Abscess  Location:  Torso Torso abscess location:  Upper back Abscess quality: painful   Red streaking: no   Duration:  3 days Progression:  Worsening Pain details:    Quality:  Unable to specify   Severity:  Mild   Duration:  3 days   Timing:  Constant   Progression:  Worsening Chronicity:  New Context: not diabetes, not immunosuppression, not injected drug use and not insect bite/sting   Relieved by:  None tried Ineffective treatments:  None tried Associated symptoms: no fever, no nausea and no vomiting   Risk factors: prior abscess   Risk factors: no family hx of MRSA and no hx of MRSA      HPI Comments: Samantha Logan is a 51 y.o. female who presents to the Emergency Department complaining of a moderate, gradually worsening area of pain and swelling to her left mid-back for several days. She has a h/o abscess in the same area which she had lanced. No alleviating factors noted. NKDA. Pt denies the possibility of pregnancy. She further denies fevers, chills, nausea, vomiting, or any other associated symptoms.  Past Medical History:  Diagnosis Date  . Anxiety   . Hypertension   . Migraine     There are no active problems to display for this patient.   Past Surgical History:  Procedure Laterality Date  . CESAREAN SECTION      OB History    No data available       Home Medications    Prior to Admission medications   Medication Sig Start Date End Date Taking? Authorizing Provider  albuterol  (PROVENTIL HFA;VENTOLIN HFA) 108 (90 BASE) MCG/ACT inhaler Inhale 1-2 puffs into the lungs every 4 (four) hours as needed for wheezing or shortness of breath. 06/02/14   Marisa Severin, MD  benzonatate (TESSALON) 200 MG capsule Take 1 capsule (200 mg total) by mouth 3 (three) times daily as needed for cough. 02/09/16   Lyndal Pulley, MD  diphenhydramine-acetaminophen (TYLENOL PM) 25-500 MG TABS Take 1 tablet by mouth at bedtime as needed (for sleep).    Historical Provider, MD  ferrous sulfate 325 (65 FE) MG EC tablet Take 1 tablet (325 mg total) by mouth 3 (three) times daily with meals. 03/09/15   Ofilia Neas, PA-C  hydrocortisone (ANUSOL-HC) 25 MG suppository Place 1 suppository (25 mg total) rectally 2 (two) times daily. 03/08/15   Ofilia Neas, PA-C  Melatonin 3 MG TABS Take 1 tablet by mouth at bedtime as needed (for sleep).    Historical Provider, MD  nitroGLYCERIN (NITROSTAT) 0.4 MG SL tablet Place 0.4 mg under the tongue every 5 (five) minutes as needed for chest pain.    Historical Provider, MD  Phenylephrine-APAP-Guaifenesin 5-325-200 MG TABS Take 2 tablets by mouth every 6 (six) hours as needed (cough and cod symptoms). 02/09/16   Lyndal Pulley, MD  polyethylene glycol powder (GLYCOLAX/MIRALAX) powder Take 17 g by mouth 2 (two) times daily as needed. 03/08/15   Ofilia Neas, PA-C  psyllium (  METAMUCIL SMOOTH TEXTURE) 58.6 % powder Take 1 packet by mouth daily. 03/08/15   Ofilia Neas, PA-C    Family History Family History  Problem Relation Age of Onset  . Hypertension Mother     Social History Social History  Substance Use Topics  . Smoking status: Never Smoker  . Smokeless tobacco: Never Used  . Alcohol use No     Allergies   Pork-derived products   Review of Systems Review of Systems  Constitutional: Negative for chills and fever.  Gastrointestinal: Negative for nausea and vomiting.  Skin: Positive for wound.  All other systems reviewed and are  negative.  Physical Exam Updated Vital Signs BP 131/80 (BP Location: Right Arm) Comment (BP Location): BP taken in pt's forearm  Pulse 72   Temp 98.2 F (36.8 C) (Oral)   Resp 16   SpO2 100%   Physical Exam  Constitutional: She is oriented to person, place, and time. She appears well-developed and well-nourished. No distress.  HENT:  Head: Normocephalic.  Eyes: Conjunctivae and EOM are normal.  Neck: Neck supple.  Cardiovascular: Normal rate.   Pulmonary/Chest: Effort normal.  Musculoskeletal: Normal range of motion.  Neurological: She is alert and oriented to person, place, and time.  Skin: Skin is warm and dry.  Firm, raised 2 cm area to the left side of the mid-back with a black center.  Psychiatric: She has a normal mood and affect. Her behavior is normal.  Nursing note and vitals reviewed.  ED Treatments / Results  Labs (all labs ordered are listed, but only abnormal results are displayed) Labs Reviewed - No data to display   Radiology No results found.  Procedures .Marland KitchenIncision and Drainage Date/Time: 07/10/2016 8:59 PM Performed by: Janne Napoleon Authorized by: Janne Napoleon   Consent:    Consent obtained:  Verbal   Consent given by:  Patient   Risks discussed:  Incomplete drainage   Alternatives discussed:  No treatment Location:    Type:  Abscess   Location:  Trunk   Trunk location:  Back Pre-procedure details:    Skin preparation:  Betadine Anesthesia (see MAR for exact dosages):    Anesthesia method:  Local infiltration   Local anesthetic:  Lidocaine 1% w/o epi Procedure type:    Complexity:  Complex Procedure details:    Needle aspiration: no     Incision types:  Single straight   Incision depth:  Dermal   Scalpel blade:  11   Wound management:  Probed and deloculated   Drainage:  Purulent   Drainage amount:  Moderate   Wound treatment:  Wound left open Post-procedure details:    Patient tolerance of procedure:  Tolerated well, no immediate  complications    (including critical care time)  DIAGNOSTIC STUDIES: Oxygen Saturation is 100% on RA, normal by my interpretation.    COORDINATION OF CARE: 7:48 PM Discussed treatment plan with pt at bedside and pt agreed to plan.  Medications Ordered in ED Medications  lidocaine (PF) (XYLOCAINE) 1 % injection 5 mL (5 mLs Infiltration Given 07/10/16 2012)     Initial Impression / Assessment and Plan / ED Course  I have reviewed the triage vital signs and the nursing notes.  Final Clinical Impressions(s) / ED Diagnoses  Patient with skin abscess. Incision and drainage performed in the ED today.  Abscess was not large enough to warrant packing or drain placement. Wound recheck in 2 days. Supportive care and return precautions discussed.  The patient appears  reasonably screened and stabilized for discharge and I doubt any other emergent medical condition requiring further screening, evaluation, or treatment in the ED prior to discharge. Final diagnoses:  Abscess    New Prescriptions Discharge Medication List as of 07/10/2016  9:11 PM    I personally performed the services described in this documentation, which was scribed in my presence. The recorded information has been reviewed and is accurate.    [address] [place], [place] 07/11/16 0865    Maia Plan, MD 07/11/16 [phone]

## 2016-09-11 ENCOUNTER — Emergency Department (HOSPITAL_COMMUNITY)
Admission: EM | Admit: 2016-09-11 | Discharge: 2016-09-11 | Disposition: A | Discharge status: Home / Self Care | Payer: BLUE CROSS/BLUE SHIELD | Attending: Emergency Medicine | Admitting: Emergency Medicine

## 2016-09-11 ENCOUNTER — Emergency Department (HOSPITAL_COMMUNITY): Payer: BLUE CROSS/BLUE SHIELD

## 2016-09-11 ENCOUNTER — Encounter (HOSPITAL_COMMUNITY): Payer: Self-pay | Admitting: Emergency Medicine

## 2016-09-11 DIAGNOSIS — Z79899 Other long term (current) drug therapy: Secondary | ICD-10-CM | POA: Diagnosis not present

## 2016-09-11 DIAGNOSIS — I10 Essential (primary) hypertension: Secondary | ICD-10-CM | POA: Insufficient documentation

## 2016-09-11 DIAGNOSIS — D649 Anemia, unspecified: Secondary | ICD-10-CM | POA: Diagnosis not present

## 2016-09-11 DIAGNOSIS — R51 Headache: Secondary | ICD-10-CM | POA: Diagnosis present

## 2016-09-11 DIAGNOSIS — R519 Headache, unspecified: Secondary | ICD-10-CM

## 2016-09-11 LAB — BASIC METABOLIC PANEL
Anion gap: 7 (ref 5–15)
BUN: 11 mg/dL (ref 6–20)
CALCIUM: 8.2 mg/dL — AB (ref 8.9–10.3)
CO2: 22 mmol/L (ref 22–32)
Chloride: 108 mmol/L (ref 101–111)
Creatinine, Ser: 0.59 mg/dL (ref 0.44–1.00)
GFR calc Af Amer: >60 mL/min (ref >60)
GFR calc non Af Amer: >60 mL/min (ref >60)
Glucose, Bld: 113 mg/dL — ABNORMAL HIGH (ref 65–99)
Potassium: 3.4 mmol/L — ABNORMAL LOW (ref 3.5–5.1)
Sodium: 137 mmol/L (ref 135–145)

## 2016-09-11 LAB — TROPONIN I: Troponin I: <0.03 ng/mL (ref <0.03)

## 2016-09-11 LAB — CBC
HCT: 31.9 % — ABNORMAL LOW (ref 36.0–46.0)
Hemoglobin: 9.5 g/dL — ABNORMAL LOW (ref 12.0–15.0)
MCH: 21.4 pg — AB (ref 26.0–34.0)
MCHC: 29.8 g/dL — AB (ref 30.0–36.0)
MCV: 72 fL — ABNORMAL LOW (ref 78.0–100.0)
PLATELETS: 480 10*3/uL — AB (ref 150–400)
RBC: 4.43 MIL/uL (ref 3.87–5.11)
RDW: 17.1 % — ABNORMAL HIGH (ref 11.5–15.5)
WBC: 4.7 10*3/uL (ref 4.0–10.5)

## 2016-09-11 LAB — CBG MONITORING, ED: GLUCOSE-CAPILLARY: 104 mg/dL — AB (ref 65–99)

## 2016-09-11 MED ORDER — MORPHINE SULFATE (PF) 4 MG/ML IV SOLN
4.0000 mg | Freq: Once | INTRAVENOUS | Status: AC
  Administered 2016-09-11: 4 mg via INTRAVENOUS
  Filled 2016-09-11: qty 1

## 2016-09-11 MED ORDER — FERROUS SULFATE 325 (65 FE) MG PO TABS: 325.0000 mg | ORAL_TABLET | Freq: Every day | ORAL | 0 refills | Status: DC

## 2016-09-11 MED ORDER — METOCLOPRAMIDE HCL 5 MG/ML IJ SOLN
10.0000 mg | Freq: Once | INTRAMUSCULAR | Status: AC
  Administered 2016-09-11: 10 mg via INTRAVENOUS
  Filled 2016-09-11: qty 2

## 2016-09-11 MED ORDER — KETOROLAC TROMETHAMINE 30 MG/ML IJ SOLN
30.0000 mg | Freq: Once | INTRAMUSCULAR | Status: AC
  Administered 2016-09-11: 30 mg via INTRAVENOUS
  Filled 2016-09-11: qty 1

## 2016-09-11 NOTE — ED Notes (Signed)
cbg was 104 

## 2016-09-11 NOTE — ED Provider Notes (Signed)
[facility]-EMERGENCY DEPT Provider Note   CSN: [id number] Arrival date & time: 09/11/16  0732     History   Chief Complaint Chief Complaint  Patient presents with  . Abdominal Pain  . Chest Pain    HPI Samantha Logan is a 51 y.o. female.  HPI Patient presents complaining of frontal headache with some clumsiness of her right hand earlier today.  No prior history of stroke.  She has a history of headaches before.  She's never had weakness of her arms or legs with her headaches.  She complained of some generalized abdominal pain earlier this morning that now since resolved.  Denies nausea and vomiting at this time.  EMS reported some nausea earlier for which she was given Zofran with improvement.  EMS performed orthostatics which were negative.  Blood sugar was 119.   Past Medical History:  Diagnosis Date  . Anxiety   . Hypertension   . Migraine     There are no active problems to display for this patient.   Past Surgical History:  Procedure Laterality Date  . CESAREAN SECTION      OB History    No data available       Home Medications    Prior to Admission medications   Medication Sig Start Date End Date Taking? Authorizing Provider  albuterol (PROVENTIL HFA;VENTOLIN HFA) 108 (90 BASE) MCG/ACT inhaler Inhale 1-2 puffs into the lungs every 4 (four) hours as needed for wheezing or shortness of breath. 06/02/14  Yes Marisa Severintter, Olga, MD  ibuprofen (ADVIL,MOTRIN) 200 MG tablet Take 200 mg by mouth every 6 (six) hours as needed for moderate pain.   Yes [provider]  Melatonin 3 MG TABS Take 1 tablet by mouth at bedtime as needed (for sleep).   Yes [provider]  benzonatate (TESSALON) 200 MG capsule Take 1 capsule (200 mg total) by mouth 3 (three) times daily as needed for cough. Patient not taking: Reported on 09/11/2016 02/09/16   Lyndal PulleyKnott, Daniel, MD  ferrous sulfate 325 (65 FE) MG tablet Take 1 tablet (325 mg total) by mouth daily. 09/11/16   Azalia Bilisampos, Cassi Jenne, MD    hydrocortisone (ANUSOL-HC) 25 MG suppository Place 1 suppository (25 mg total) rectally 2 (two) times daily. Patient not taking: Reported on 09/11/2016 03/08/15   Ofilia Neaslark, Michael L, PA-C  nitroGLYCERIN (NITROSTAT) 0.4 MG SL tablet Place 0.4 mg under the tongue every 5 (five) minutes as needed for chest pain.    [provider]  Phenylephrine-APAP-Guaifenesin 5-325-200 MG TABS Take 2 tablets by mouth every 6 (six) hours as needed (cough and cod symptoms). Patient not taking: Reported on 09/11/2016 02/09/16   Lyndal PulleyKnott, Daniel, MD  polyethylene glycol powder (GLYCOLAX/MIRALAX) powder Take 17 g by mouth 2 (two) times daily as needed. Patient not taking: Reported on 09/11/2016 03/08/15   Ofilia Neaslark, Michael L, PA-C  psyllium (METAMUCIL SMOOTH TEXTURE) 58.6 % powder Take 1 packet by mouth daily. Patient not taking: Reported on 09/11/2016 03/08/15   Ofilia Neaslark, Michael L, PA-C    Family History Family History  Problem Relation Age of Onset  . Hypertension Mother     Social History Social History  Substance Use Topics  . Smoking status: Never Smoker  . Smokeless tobacco: Never Used  . Alcohol use No     Allergies   Pork-derived products   Review of Systems Review of Systems  All other systems reviewed and are negative.    Physical Exam Updated Vital Signs BP 132/85   Pulse  70   Temp 97.7 F (36.5 C) (Oral)   Resp 16   Wt 105.7 kg (233 lb)   SpO2 97%   BMI 37.04 kg/m   Physical Exam  Constitutional: She is oriented to person, place, and time. She appears well-developed and well-nourished. No distress.  HENT:  Head: Normocephalic and atraumatic.  Eyes: EOM are normal. Pupils are equal, round, and reactive to light.  Neck: Normal range of motion.  Cardiovascular: Normal rate, regular rhythm and normal heart sounds.   Pulmonary/Chest: Effort normal and breath sounds normal.  Abdominal: Soft. She exhibits no distension. There is no tenderness.  Musculoskeletal: Normal range of  motion.  Neurological: She is alert and oriented to person, place, and time.  5/5 strength in major muscle groups of  bilateral upper and lower extremities. Speech normal. No facial asymetry.   Skin: Skin is warm and dry.  Psychiatric: She has a normal mood and affect. Judgment normal.  Nursing note and vitals reviewed.    ED Treatments / Results  Labs (all labs ordered are listed, but only abnormal results are displayed) Labs Reviewed  CBC - Abnormal; Notable for the following:       Result Value   Hemoglobin 9.5 (*)    HCT 31.9 (*)    MCV 72.0 (*)    MCH 21.4 (*)    MCHC 29.8 (*)    RDW 17.1 (*)    Platelets 480 (*)    All other components within normal limits  BASIC METABOLIC PANEL - Abnormal; Notable for the following:    Potassium 3.4 (*)    Glucose, Bld 113 (*)    Calcium 8.2 (*)    All other components within normal limits  CBG MONITORING, ED - Abnormal; Notable for the following:    Glucose-Capillary 104 (*)    All other components within normal limits  TROPONIN I    EKG  EKG Interpretation  Date/Time:  Tuesday September 11 2016 07:33:42 EDT Ventricular Rate:  69 PR Interval:    QRS Duration: 91 QT Interval:  403 QTC Calculation: 432 R Axis:   60 Text Interpretation:  Sinus rhythm Prolonged PR interval Borderline T abnormalities, anterior leads No significant change was found Confirmed by Azalia Bilis ([id number]) on 09/11/2016 8:00:52 AM       Radiology Ct Head Wo Contrast  Result Date: 09/11/2016 CLINICAL DATA:  Severe headache, nausea EXAM: CT HEAD WITHOUT CONTRAST TECHNIQUE: Contiguous axial images were obtained from the base of the skull through the vertex without intravenous contrast. COMPARISON:  09/24/2008 FINDINGS: Brain: No evidence of acute infarction, hemorrhage, hydrocephalus, extra-axial collection or mass lesion/mass effect. Streak artifact in the left occipital region. Vascular: No hyperdense vessel or unexpected calcification. Skull: Normal. Negative  for fracture or focal lesion. Sinuses/Orbits: The visualized paranasal sinuses are essentially clear. The mastoid air cells are unopacified. Other: None. IMPRESSION: Normal head CT. Electronically Signed   By: Charline Bills M.D.   On: 09/11/2016 10:57    Procedures Procedures (including critical care time)  Medications Ordered in ED Medications  metoCLOPramide (REGLAN) injection 10 mg (10 mg Intravenous Given 09/11/16 0926)  ketorolac (TORADOL) 30 MG/ML injection 30 mg (30 mg Intravenous Given 09/11/16 0926)  morphine 4 MG/ML injection 4 mg (4 mg Intravenous Given 09/11/16 0926)     Initial Impression / Assessment and Plan / ED Course  I have reviewed the triage vital signs and the nursing notes.  Pertinent labs & imaging results that were available during my care  of the patient were reviewed by me and considered in my medical decision making (see chart for details).     Improvement in her headache after migraine cocktail.  Imaging without abnormality.  Discharge home in good condition.  Primary care follow-up.  Repeat abdominal exam without tenderness.  Final Clinical Impressions(s) / ED Diagnoses   Final diagnoses:  Acute nonintractable headache, unspecified headache type  Anemia, unspecified type    New Prescriptions New Prescriptions   FERROUS SULFATE 325 (65 FE) MG TABLET    Take 1 tablet (325 mg total) by mouth daily.     Azalia Bilis, MD 09/11/16 [phone]

## 2016-09-11 NOTE — ED Triage Notes (Signed)
Pt in [facility] with c/o generalized abd pain since 0230 this morning. En route, pt also endorsed sharp central cp, 8/10. Pt given Zofran PO for nausea. Alert, VSS, c/o dizziness, negative orthostatics. C/o palpitations, PVC's on EKG. CBG 119

## 2016-12-16 IMAGING — CR DG CHEST 2V
2 series · 2 of 2 positions shown · non-contrast
Comparison: 08/19/2014

CLINICAL DATA: Chest pain and weakness.

EXAM:
CHEST  2 VIEW

[chest pa]
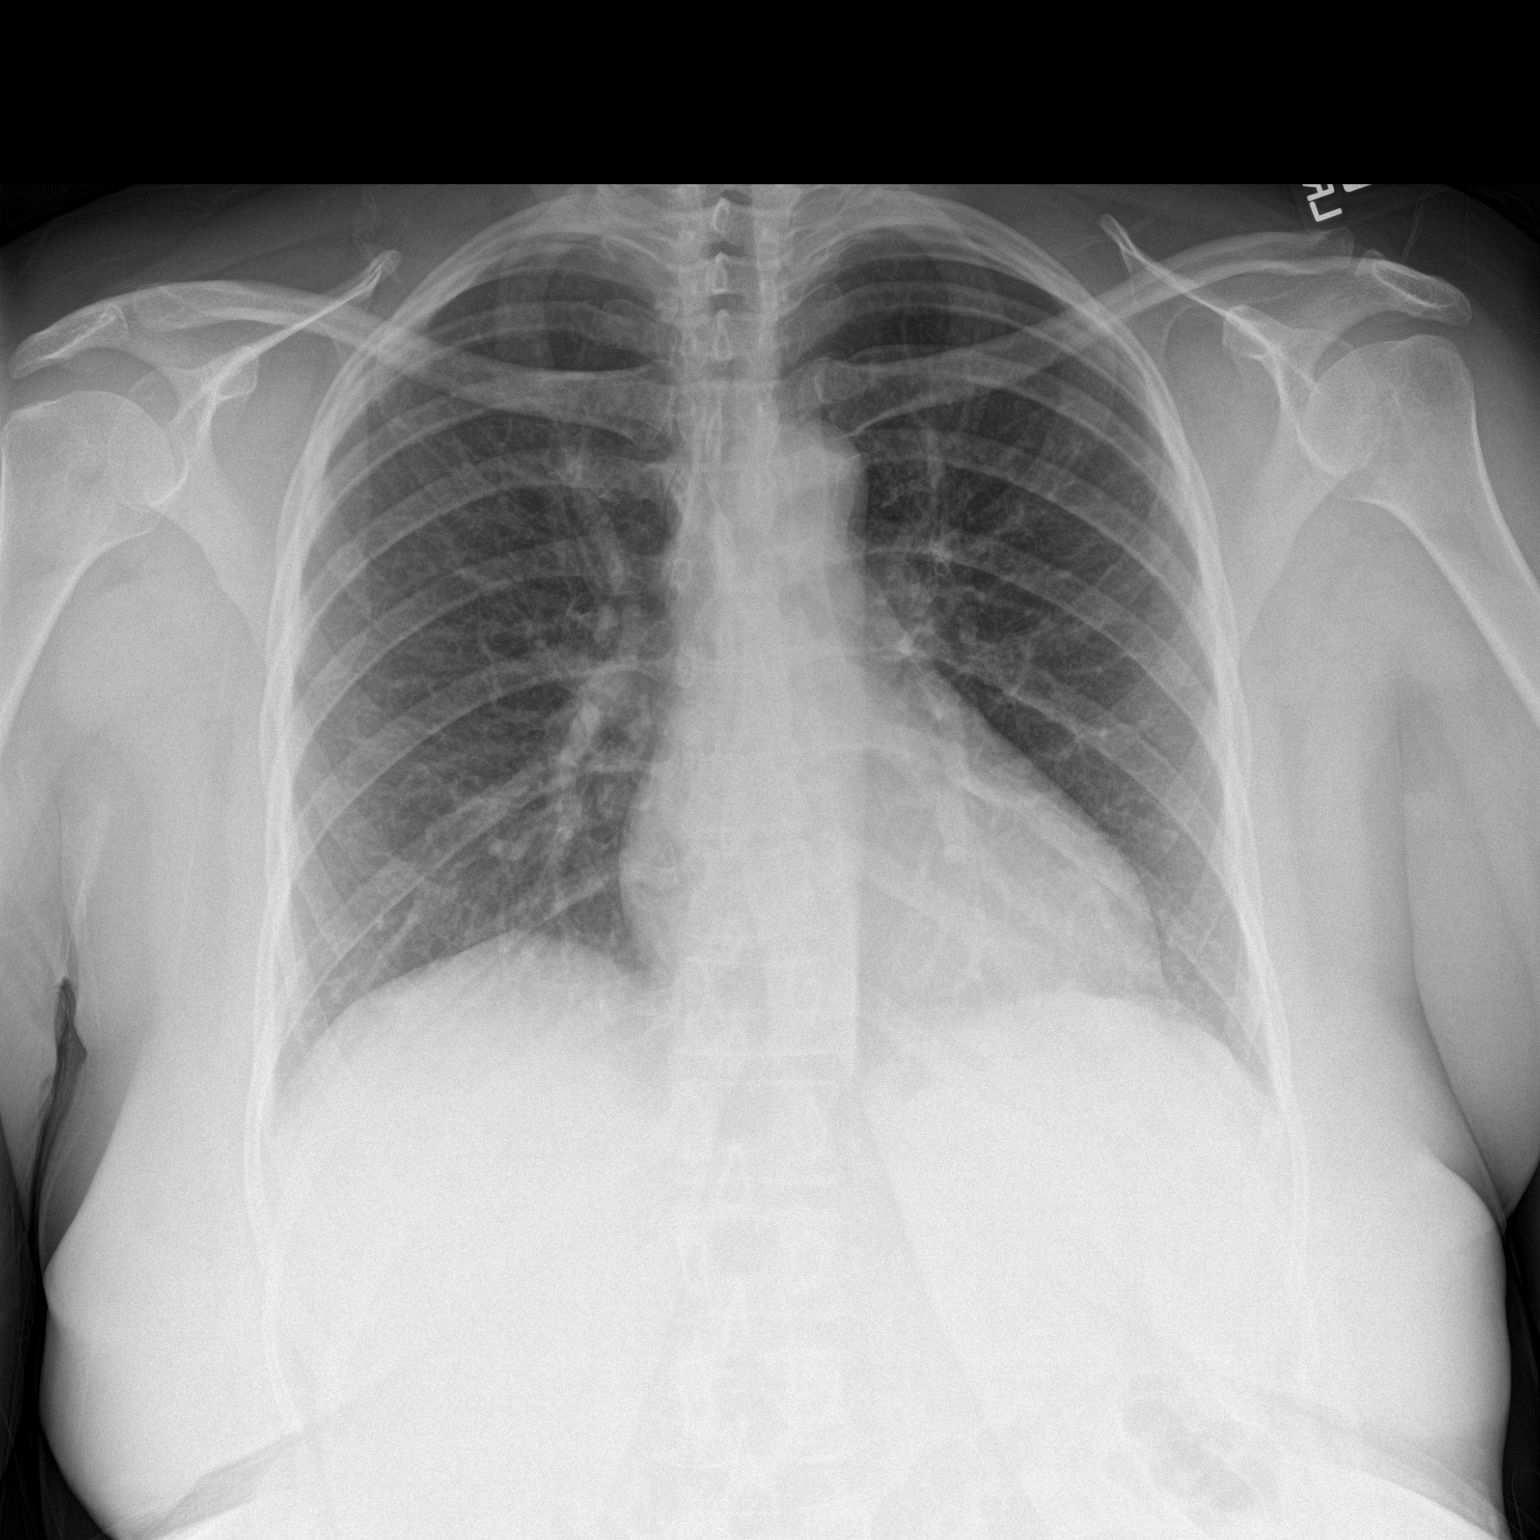

[chest lat]
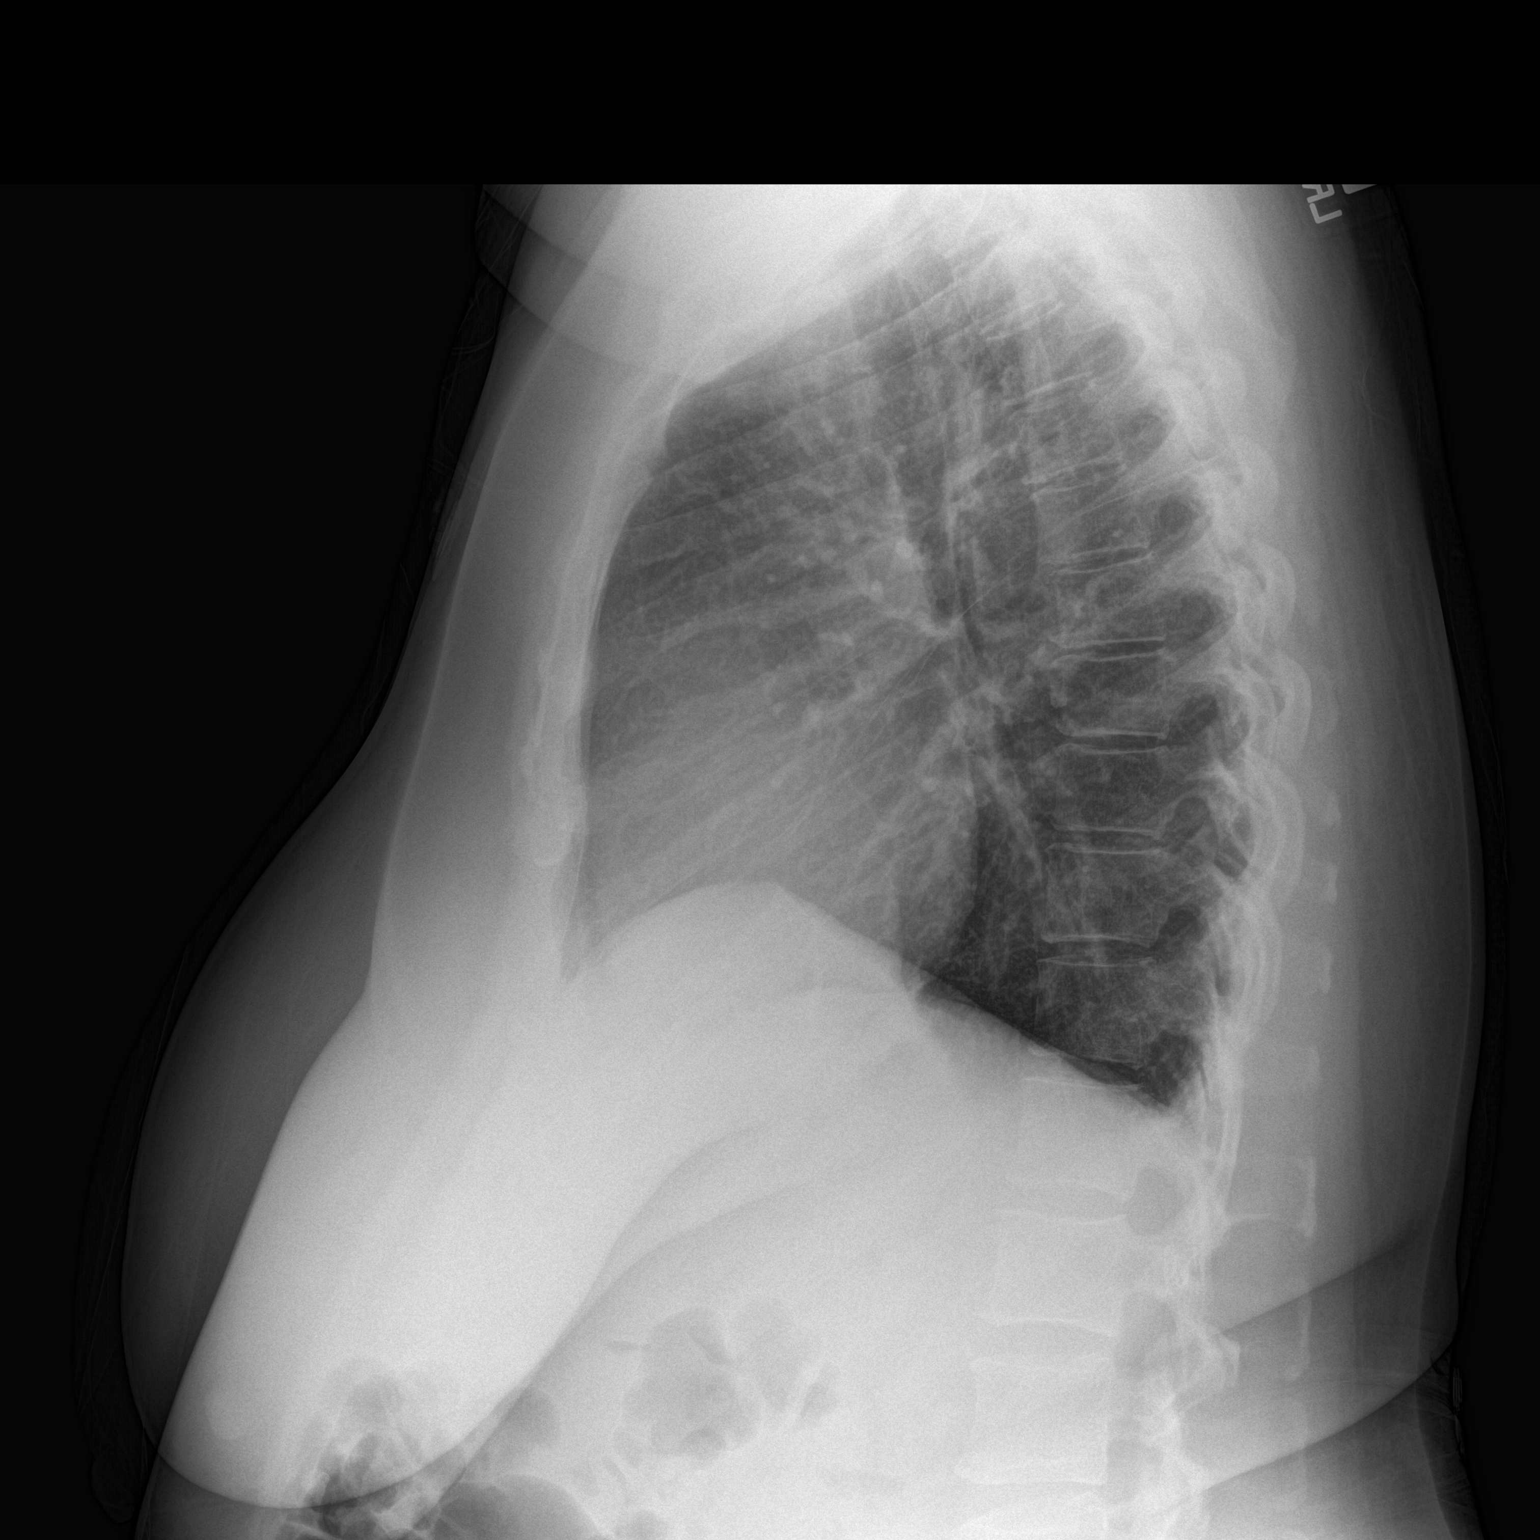

[2 of 2 positions shown; findings below may reference images not displayed]

FINDINGS: The heart size and mediastinal contours are within normal limits.
Both lungs are clear. The visualized skeletal structures are
unremarkable.
IMPRESSION: No active cardiopulmonary disease.

## 2017-02-28 ENCOUNTER — Ambulatory Visit (INDEPENDENT_AMBULATORY_CARE_PROVIDER_SITE_OTHER): Payer: BLUE CROSS/BLUE SHIELD | Admitting: Physician Assistant

## 2017-02-28 ENCOUNTER — Other Ambulatory Visit: Payer: Self-pay

## 2017-02-28 VITALS — BP 126/76 | HR 82 | Temp 98.7°F | Resp 16 | Ht 66.0 in | Wt 232.0 lb

## 2017-02-28 DIAGNOSIS — Z0000 Encounter for general adult medical examination without abnormal findings: Secondary | ICD-10-CM | POA: Diagnosis not present

## 2017-02-28 DIAGNOSIS — R7989 Other specified abnormal findings of blood chemistry: Secondary | ICD-10-CM | POA: Diagnosis not present

## 2017-02-28 DIAGNOSIS — Z1329 Encounter for screening for other suspected endocrine disorder: Secondary | ICD-10-CM | POA: Diagnosis not present

## 2017-02-28 DIAGNOSIS — Z13228 Encounter for screening for other metabolic disorders: Secondary | ICD-10-CM | POA: Diagnosis not present

## 2017-02-28 DIAGNOSIS — Z130 Encounter for screening for diseases of the blood and blood-forming organs and certain disorders involving the immune mechanism: Secondary | ICD-10-CM | POA: Diagnosis not present

## 2017-02-28 DIAGNOSIS — Z1321 Encounter for screening for nutritional disorder: Secondary | ICD-10-CM

## 2017-02-28 LAB — POCT URINALYSIS DIP (MANUAL ENTRY)
BILIRUBIN UA: NEGATIVE mg/dL
Bilirubin, UA: NEGATIVE
Glucose, UA: NEGATIVE mg/dL
LEUKOCYTES UA: NEGATIVE
NITRITE UA: NEGATIVE
PROTEIN UA: NEGATIVE mg/dL
Spec Grav, UA: >=1.03 — AB (ref 1.010–1.025)
UROBILINOGEN UA: 0.2 U/dL
pH, UA: 6 (ref 5.0–8.0)

## 2017-02-28 NOTE — Patient Instructions (Signed)
Per your records you need a breast biopsy.  Please go to the breast center today and schedule an appointment.

## 2017-02-28 NOTE — Progress Notes (Signed)
03/04/2017 1:11 PM   DOB: 02/29/1966 / MRN: [id number]  SUBJECTIVE:  Samantha Logan is a 51 y.o. female presenting for annual exam.  Refuses a pap.  Has not followed up on breast mass found in early 2017 despite clearly being advised in both writing and in person at the Breast Center.  Tells me that she has been so focused on her children that she has stopped seeking health care for herself over the last few years.  She assures me that she will call the breast center and understands that it is important to go.  Denies any weight loss.  She declines a pap smear, flu shot today.  She has been to surgery regarding her external hemorrhoids but could not afford the surgery due to a lack of insurance. Has a history of iron deficiency anemia but lost her Iron tabs and is requested more of these today.   She is allergic to pork-derived products.   She  has a past medical history of Anxiety, Hypertension, and Migraine.    She  reports that  has never smoked. she has never used smokeless tobacco. She reports that she does not drink alcohol or use drugs. She  has no sexual activity history on file. The patient  has a past surgical history that includes Cesarean section.  Her family history includes Hypertension in her mother.  Review of Systems  Constitutional: Negative for chills, diaphoresis and fever.  Eyes: Negative.   Respiratory: Negative for shortness of breath.   Cardiovascular: Negative for chest pain, orthopnea and leg swelling.  Gastrointestinal: Negative for nausea.  Skin: Negative for rash.  Neurological: Negative for dizziness, sensory change, speech change, focal weakness and headaches.    The problem list and medications were reviewed and updated by myself where necessary and exist elsewhere in the encounter.   OBJECTIVE:  BP 126/76   Pulse 82   Temp 98.7 F (37.1 C) (Oral)   Resp 16   Ht 5\' 6"  (1.676 m)   Wt 232 lb (105.2 kg)   LMP  (LMP Unknown)   BMI 37.45 kg/m   Wt  Readings from Last 3 Encounters:  02/28/17 232 lb (105.2 kg)  09/11/16 233 lb (105.7 kg)  02/13/16 233 lb (105.7 kg)     Physical Exam  Constitutional: She is oriented to person, place, and time.  HENT:  Right Ear: External ear normal.  Left Ear: External ear normal.  Nose: Mucosal edema present. Right sinus exhibits no maxillary sinus tenderness and no frontal sinus tenderness. Left sinus exhibits no maxillary sinus tenderness and no frontal sinus tenderness.  Mouth/Throat: Oropharynx is clear and moist. No oropharyngeal exudate.  Eyes: Conjunctivae are normal. Pupils are equal, round, and reactive to light.  Cardiovascular: Regular rhythm and normal heart sounds.  Pulmonary/Chest: Effort normal and breath sounds normal.  Neurological: She is alert and oriented to person, place, and time.  Skin: Skin is warm and dry. No rash noted. She is not diaphoretic. No erythema. No pallor.  Psychiatric: Her behavior is normal.     Recent Results (from the past 2160 hour(s))  POCT urinalysis dipstick     Status: Abnormal   Collection Time: 02/28/17  1:41 PM  Result Value Ref Range   Color, UA yellow yellow   Clarity, UA clear clear   Glucose, UA negative negative mg/dL   Bilirubin, UA negative negative   Ketones, POC UA negative negative mg/dL   Spec Grav, UA >=4.098>=1.030 (A) 1.010 -  1.025   Blood, UA trace-intact (A) negative   pH, UA 6.0 5.0 - 8.0   Protein Ur, POC negative negative mg/dL   Urobilinogen, UA 0.2 0.2 or 1.0 E.U./dL   Nitrite, UA Negative Negative   Leukocytes, UA Negative Negative  CBC     Status: Abnormal   Collection Time: 02/28/17  2:10 PM  Result Value Ref Range   WBC 4.8 3.4 - 10.8 x10E3/uL   RBC 4.57 3.77 - 5.28 x10E6/uL   Hemoglobin 10.0 (L) 11.1 - 15.9 g/dL   Hematocrit 16.132.2 (L) 09.634.0 - 46.6 %   MCV 71 (L) 79 - 97 fL   MCH 21.9 (L) 26.6 - 33.0 pg   MCHC 31.1 (L) 31.5 - 35.7 g/dL   RDW 04.517.5 (H) 40.912.3 - 81.115.4 %   Platelets 643 (H) 150 - 379 x10E3/uL  CMP and  Liver     Status: Abnormal   Collection Time: 02/28/17  2:10 PM  Result Value Ref Range   Glucose 92 65 - 99 mg/dL   BUN 11 6 - 24 mg/dL   Creatinine, Ser 9.140.73 0.57 - 1.00 mg/dL   GFR calc non Af Amer 97 >59 mL/min/1.73   GFR calc Af Amer 112 >59 mL/min/1.73   Sodium 143 134 - 144 mmol/L   Potassium 4.1 3.5 - 5.2 mmol/L   Chloride 107 (H) 96 - 106 mmol/L   CO2 23 20 - 29 mmol/L   Calcium 8.8 8.7 - 10.2 mg/dL   Total Protein 6.4 6.0 - 8.5 g/dL   Albumin 3.6 3.5 - 5.5 g/dL   Bilirubin Total 0.2 0.0 - 1.2 mg/dL   Bilirubin, Direct 7.820.06 0.00 - 0.40 mg/dL   Alkaline Phosphatase 81 39 - 117 IU/L   AST 18 0 - 40 IU/L   ALT 7 0 - 32 IU/L  Hemoglobin A1c     Status: None   Collection Time: 02/28/17  2:10 PM  Result Value Ref Range   Hgb A1c MFr Bld 5.5 4.8 - 5.6 %    Comment:          Prediabetes: 5.7 - 6.4          Diabetes: >6.4          Glycemic control for adults with diabetes: <7.0    Est. average glucose Bld gHb Est-mCnc 111 mg/dL  TSH     Status: None   Collection Time: 02/28/17  2:10 PM  Result Value Ref Range   TSH 1.350 0.450 - 4.500 uIU/mL     ASSESSMENT AND PLAN:  Samantha Logan was seen today for annual exam.  Diagnoses and all orders for this visit:  Annual physical exam  Screening for endocrine, nutritional, metabolic and immunity disorder -     CBC -     CMP and Liver -     Hemoglobin A1c -     POCT urinalysis dipstick -     TSH    The patient is advised to call or return to clinic if she does not see an improvement in symptoms, or to seek the care of the closest emergency department if she worsens with the above plan.   Deliah BostonMichael Aliyanah Rozas, MHS, PA-C [facility] at [facility] 03/04/2017 1:11 PM

## 2017-03-01 LAB — CMP AND LIVER
ALK PHOS: 81 IU/L (ref 39–117)
ALT: 7 IU/L (ref 0–32)
AST: 18 IU/L (ref 0–40)
Albumin: 3.6 g/dL (ref 3.5–5.5)
BUN: 11 mg/dL (ref 6–24)
Bilirubin Total: 0.2 mg/dL (ref 0.0–1.2)
Bilirubin, Direct: 0.06 mg/dL (ref 0.00–0.40)
CO2: 23 mmol/L (ref 20–29)
CREATININE: 0.73 mg/dL (ref 0.57–1.00)
Calcium: 8.8 mg/dL (ref 8.7–10.2)
Chloride: 107 mmol/L — ABNORMAL HIGH (ref 96–106)
GFR calc Af Amer: 112 mL/min/{1.73_m2} (ref >59)
GFR calc non Af Amer: 97 mL/min/{1.73_m2} (ref >59)
Glucose: 92 mg/dL (ref 65–99)
Potassium: 4.1 mmol/L (ref 3.5–5.2)
SODIUM: 143 mmol/L (ref 134–144)
TOTAL PROTEIN: 6.4 g/dL (ref 6.0–8.5)

## 2017-03-01 LAB — CBC
Hematocrit: 32.2 % — ABNORMAL LOW (ref 34.0–46.6)
Hemoglobin: 10 g/dL — ABNORMAL LOW (ref 11.1–15.9)
MCH: 21.9 pg — ABNORMAL LOW (ref 26.6–33.0)
MCHC: 31.1 g/dL — ABNORMAL LOW (ref 31.5–35.7)
MCV: 71 fL — AB (ref 79–97)
PLATELETS: 643 10*3/uL — AB (ref 150–379)
RBC: 4.57 x10E6/uL (ref 3.77–5.28)
RDW: 17.5 % — ABNORMAL HIGH (ref 12.3–15.4)
WBC: 4.8 10*3/uL (ref 3.4–10.8)

## 2017-03-01 LAB — HEMOGLOBIN A1C
ESTIMATED AVERAGE GLUCOSE: 111 mg/dL
HEMOGLOBIN A1C: 5.5 % (ref 4.8–5.6)

## 2017-03-01 LAB — TSH: TSH: 1.35 u[IU]/mL (ref 0.450–4.500)

## 2017-03-01 NOTE — Progress Notes (Signed)
Please add on iron panel.

## 2017-03-05 NOTE — Addendum Note (Signed)
Addended by: Baldwin CrownJOHNSON, SHAQUETTA D on: 03/05/2017 11:44 AM   Modules accepted: Orders

## 2017-03-06 LAB — IRON,TIBC AND FERRITIN PANEL
FERRITIN: 9 ng/mL — AB (ref 15–150)
IRON SATURATION: 8 % — AB (ref 15–55)
IRON: 23 ug/dL — AB (ref 27–159)
TIBC: 274 ug/dL (ref 250–450)
UIBC: 251 ug/dL (ref 131–425)

## 2017-03-08 ENCOUNTER — Other Ambulatory Visit: Payer: Self-pay | Admitting: Physician Assistant

## 2017-03-08 MED ORDER — FERROUS SULFATE 325 (65 FE) MG PO TBEC: 325.0000 mg | DELAYED_RELEASE_TABLET | Freq: Three times a day (TID) | ORAL | 4 refills | Status: AC

## 2017-03-16 NOTE — Progress Notes (Signed)
FYI: Replacing iron.  Patient advised via mychart. She is to follow up with her surgeon and plans to call and set up for a check at the breast center given most recent mammogram. Deliah BostonMichael Clark, MS, PA-C 1:31 PM, 03/16/2017

## 2017-05-29 ENCOUNTER — Emergency Department (HOSPITAL_COMMUNITY)
Admission: EM | Admit: 2017-05-29 | Discharge: 2017-05-29 | Disposition: A | Discharge status: Home / Self Care | Payer: BLUE CROSS/BLUE SHIELD | Attending: Emergency Medicine | Admitting: Emergency Medicine

## 2017-05-29 ENCOUNTER — Encounter (HOSPITAL_COMMUNITY): Payer: Self-pay | Admitting: Emergency Medicine

## 2017-05-29 ENCOUNTER — Other Ambulatory Visit: Payer: Self-pay

## 2017-05-29 ENCOUNTER — Emergency Department (HOSPITAL_COMMUNITY): Payer: BLUE CROSS/BLUE SHIELD

## 2017-05-29 DIAGNOSIS — I10 Essential (primary) hypertension: Secondary | ICD-10-CM | POA: Diagnosis not present

## 2017-05-29 DIAGNOSIS — Y990 Civilian activity done for income or pay: Secondary | ICD-10-CM | POA: Insufficient documentation

## 2017-05-29 DIAGNOSIS — S6991XA Unspecified injury of right wrist, hand and finger(s), initial encounter: Secondary | ICD-10-CM | POA: Diagnosis present

## 2017-05-29 DIAGNOSIS — Y9389 Activity, other specified: Secondary | ICD-10-CM | POA: Insufficient documentation

## 2017-05-29 DIAGNOSIS — Y9289 Other specified places as the place of occurrence of the external cause: Secondary | ICD-10-CM | POA: Diagnosis not present

## 2017-05-29 DIAGNOSIS — X509XXA Other and unspecified overexertion or strenuous movements or postures, initial encounter: Secondary | ICD-10-CM | POA: Insufficient documentation

## 2017-05-29 DIAGNOSIS — S63501A Unspecified sprain of right wrist, initial encounter: Secondary | ICD-10-CM | POA: Diagnosis not present

## 2017-05-29 MED ORDER — IBUPROFEN 200 MG PO TABS
600.0000 mg | ORAL_TABLET | Freq: Once | ORAL | Status: AC
  Administered 2017-05-29: 03:00:00 600 mg via ORAL
  Filled 2017-05-29: qty 1

## 2017-05-29 MED ORDER — IBUPROFEN 200 MG PO TABS: 600.0000 mg | ORAL_TABLET | Freq: Four times a day (QID) | ORAL | 0 refills | Status: AC | PRN

## 2017-05-29 NOTE — Progress Notes (Signed)
Orthopedic Tech Progress Note Patient Details:  Samantha GrayerHanan A Logan 09/18/1965 [id number]  Ortho Devices Type of Ortho Device: Thumb velcro splint Ortho Device/Splint Location: rue Ortho Device/Splint Interventions: Ordered, Application, Adjustment   Post Interventions Patient Tolerated: Well Instructions Provided: Care of device, Adjustment of device   Trinna PostMartinez, Jaramiah Bossard J 05/29/2017, 3:34 AM

## 2017-05-29 NOTE — ED Provider Notes (Signed)
[facility] Provider Note   CSN: [id number] Arrival date & time: 05/29/17  0008     History   Chief Complaint Chief Complaint  Patient presents with  . Hand Pain    HPI Samantha Logan is a 52 y.o. female.  Patient here for evaluation of right wrist pain that started today. She reports repetitive hand motion at work with lifting using the right hand and now has pain at radial aspect and base of thumb. She states the pain is significant and there is associated swelling. No known direct injury. No numbness.    The history is provided by the patient. No language interpreter was used.  Hand Pain     Past Medical History:  Diagnosis Date  . Anxiety   . Hypertension   . Migraine     There are no active problems to display for this patient.   Past Surgical History:  Procedure Laterality Date  . CESAREAN SECTION      OB History    No data available       Home Medications    Prior to Admission medications   Medication Sig Start Date End Date Taking? Authorizing Provider  ferrous sulfate 325 (65 FE) MG EC tablet Take 1 tablet (325 mg total) by mouth 3 (three) times daily with meals. 03/08/17   Ofilia Neaslark, Michael L, PA-C  ibuprofen (ADVIL,MOTRIN) 200 MG tablet Take 200 mg by mouth every 6 (six) hours as needed for moderate pain.    [provider]  Melatonin 3 MG TABS Take 1 tablet by mouth at bedtime as needed (for sleep).    [provider]    Family History Family History  Problem Relation Age of Onset  . Hypertension Mother     Social History Social History   Tobacco Use  . Smoking status: Never Smoker  . Smokeless tobacco: Never Used  Substance Use Topics  . Alcohol use: No  . Drug use: No     Allergies   Pork-derived products   Review of Systems Review of Systems  Constitutional: Negative for chills and fever.  Musculoskeletal:       See HPI.  Skin: Negative.   Neurological: Negative.   Negative for numbness.     Physical Exam Updated Vital Signs BP 121/73 (BP Location: Right Arm)   Pulse 87   Temp 98.2 F (36.8 C) (Oral)   Resp 18   SpO2 99%   Physical Exam  Constitutional: She is oriented to person, place, and time. She appears well-developed and well-nourished.  Neck: Normal range of motion.  Pulmonary/Chest: Effort normal.  Musculoskeletal:  Right wrist is mildly swollen along radial aspect. No discoloration or deformity. FROM thumb and other digits, as well as guarded flex/ext of wrist due to pain.   Neurological: She is alert and oriented to person, place, and time. No sensory deficit.  Skin: Skin is warm and dry. Capillary refill takes less than 2 seconds. No erythema.     ED Treatments / Results  Labs (all labs ordered are listed, but only abnormal results are displayed) Labs Reviewed - No data to display  EKG  EKG Interpretation None       Radiology Dg Wrist Complete Right  Result Date: 05/29/2017 CLINICAL DATA:  Right wrist pain and swelling. EXAM: RIGHT WRIST - COMPLETE 3+ VIEW COMPARISON:  None. FINDINGS: There is no evidence of fracture or dislocation. There is no evidence of arthropathy or other focal bone abnormality.  Soft tissues are unremarkable. IMPRESSION: Negative radiographs of the right wrist. Electronically Signed   By: Rubye Oaks M.D.   On: 05/29/2017 01:00    Procedures Procedures (including critical care time)  Medications Ordered in ED Medications  ibuprofen (ADVIL,MOTRIN) tablet 600 mg (not administered)     Initial Impression / Assessment and Plan / ED Course  I have reviewed the triage vital signs and the nursing notes.  Pertinent labs & imaging results that were available during my care of the patient were reviewed by me and considered in my medical decision making (see chart for details).     Right wrist pain with acute injury. Negative imaging. Likely wrist strain injury. Splint provided. REcommended  ibuprofen and prn PCP follow up.   Final Clinical Impressions(s) / ED Diagnoses   Final diagnoses:  None   1. Right wrist strain  ED Discharge Orders    None       Elpidio Anis, PA-C 05/29/17 0981    Shon Baton, MD 05/29/17 [phone]

## 2017-05-29 NOTE — ED Triage Notes (Signed)
Pt states she is having 8/10 right hand pain, she states that she uses her hand all the time at work and now she can even hold a cup with it. Denies any injury.

## 2017-05-29 NOTE — ED Notes (Signed)
Ortho Tech paged.

## 2017-07-18 ENCOUNTER — Other Ambulatory Visit: Payer: Self-pay

## 2017-07-18 ENCOUNTER — Encounter (HOSPITAL_COMMUNITY): Payer: Self-pay

## 2017-07-18 ENCOUNTER — Emergency Department (HOSPITAL_COMMUNITY)
Admission: EM | Admit: 2017-07-18 | Discharge: 2017-07-18 | Disposition: A | Discharge status: Home / Self Care | Payer: BLUE CROSS/BLUE SHIELD | Attending: Emergency Medicine | Admitting: Emergency Medicine

## 2017-07-18 DIAGNOSIS — Z5321 Procedure and treatment not carried out due to patient leaving prior to being seen by health care provider: Secondary | ICD-10-CM | POA: Diagnosis not present

## 2017-07-18 DIAGNOSIS — R51 Headache: Secondary | ICD-10-CM | POA: Insufficient documentation

## 2017-07-18 NOTE — ED Triage Notes (Signed)
Pt arrives via POV for c/o headache onset this mornign. Pt c/o throbbing frontal HA. Pt reports no hx of same, reports nausea, no vomiting. Endorses photophobia.

## 2018-02-18 ENCOUNTER — Encounter: Payer: BLUE CROSS/BLUE SHIELD | Admitting: Emergency Medicine
